# Patient Record
Sex: Female | Born: 1960 | Race: White | Hispanic: No | Marital: Married | Smoking: Never smoker
Health system: Southern US, Community
[De-identification: ages and names within clinical notes are randomized; demographics above are authoritative.]

## PROBLEM LIST (undated history)

## (undated) DIAGNOSIS — Z86711 Personal history of pulmonary embolism: Secondary | ICD-10-CM

## (undated) DIAGNOSIS — E785 Hyperlipidemia, unspecified: Secondary | ICD-10-CM

## (undated) DIAGNOSIS — Z86718 Personal history of other venous thrombosis and embolism: Secondary | ICD-10-CM

## (undated) DIAGNOSIS — N200 Calculus of kidney: Secondary | ICD-10-CM

## (undated) DIAGNOSIS — D6851 Activated protein C resistance: Secondary | ICD-10-CM

## (undated) DIAGNOSIS — F4310 Post-traumatic stress disorder, unspecified: Secondary | ICD-10-CM

## (undated) DIAGNOSIS — Z87442 Personal history of urinary calculi: Secondary | ICD-10-CM

---

## 1995-10-18 HISTORY — PX: CERVICAL FUSION: SHX112

## 1998-02-27 ENCOUNTER — Inpatient Hospital Stay (HOSPITAL_COMMUNITY): Admission: EM | Admit: 1998-02-27 | Discharge: 1998-03-01 | Payer: Self-pay | Admitting: *Deleted

## 1999-06-01 ENCOUNTER — Encounter: Payer: Self-pay | Admitting: Neurosurgery

## 1999-06-01 ENCOUNTER — Ambulatory Visit (HOSPITAL_COMMUNITY): Admission: RE | Admit: 1999-06-01 | Discharge: 1999-06-01 | Payer: Self-pay | Admitting: Neurosurgery

## 1999-11-09 ENCOUNTER — Encounter: Payer: Self-pay | Admitting: Neurosurgery

## 1999-11-09 ENCOUNTER — Ambulatory Visit (HOSPITAL_COMMUNITY): Admission: RE | Admit: 1999-11-09 | Discharge: 1999-11-09 | Payer: Self-pay | Admitting: Neurosurgery

## 2013-12-04 ENCOUNTER — Other Ambulatory Visit: Payer: Self-pay | Admitting: Internal Medicine

## 2013-12-04 DIAGNOSIS — Z1231 Encounter for screening mammogram for malignant neoplasm of breast: Secondary | ICD-10-CM

## 2013-12-16 ENCOUNTER — Ambulatory Visit
Admission: RE | Admit: 2013-12-16 | Discharge: 2013-12-16 | Disposition: A | Discharge status: Home / Self Care | Payer: Commercial Managed Care - PPO | Source: Ambulatory Visit | Attending: Internal Medicine | Admitting: Internal Medicine

## 2013-12-16 DIAGNOSIS — Z1231 Encounter for screening mammogram for malignant neoplasm of breast: Secondary | ICD-10-CM

## 2014-03-07 ENCOUNTER — Other Ambulatory Visit: Payer: Self-pay | Admitting: Orthopaedic Surgery

## 2014-03-07 DIAGNOSIS — M25561 Pain in right knee: Secondary | ICD-10-CM

## 2014-03-15 ENCOUNTER — Ambulatory Visit
Admission: RE | Admit: 2014-03-15 | Discharge: 2014-03-15 | Disposition: A | Discharge status: Home / Self Care | Payer: Commercial Managed Care - PPO | Source: Ambulatory Visit | Attending: Orthopaedic Surgery | Admitting: Orthopaedic Surgery

## 2014-03-15 DIAGNOSIS — M25561 Pain in right knee: Secondary | ICD-10-CM

## 2015-01-02 ENCOUNTER — Emergency Department (INDEPENDENT_AMBULATORY_CARE_PROVIDER_SITE_OTHER): Payer: Commercial Managed Care - PPO

## 2015-01-02 ENCOUNTER — Encounter (HOSPITAL_COMMUNITY): Payer: Self-pay | Admitting: *Deleted

## 2015-01-02 ENCOUNTER — Emergency Department (INDEPENDENT_AMBULATORY_CARE_PROVIDER_SITE_OTHER)
Admission: EM | Admit: 2015-01-02 | Discharge: 2015-01-02 | Disposition: A | Discharge status: Home / Self Care | Payer: Commercial Managed Care - PPO | Source: Home / Self Care | Attending: Emergency Medicine | Admitting: Emergency Medicine

## 2015-01-02 DIAGNOSIS — S93402A Sprain of unspecified ligament of left ankle, initial encounter: Secondary | ICD-10-CM

## 2015-01-02 HISTORY — DX: Activated protein C resistance: D68.51

## 2015-01-02 MED ORDER — HYDROCODONE-ACETAMINOPHEN 5-325 MG PO TABS: 1.0000 | ORAL_TABLET | ORAL | Status: DC | PRN

## 2015-01-02 NOTE — ED Notes (Signed)
States she was rushing down the steps with suitcases and fell 5 days ago. States nothing was going to stop her from going to [place] for vacation.  C/o pain, swelling and discoloration to L foot and ankle and L elbow.

## 2015-01-02 NOTE — ED Provider Notes (Addendum)
CSN: [id number]     Arrival date & time 01/02/15  1853 History   First MD Initiated Contact with Patient 01/02/15 2005     Chief Complaint  Patient presents with  . Fall   (Consider location/radiation/quality/duration/timing/severity/associated sxs/prior Treatment) HPI  She is a 54 year old woman here for evaluation of left foot and ankle pain. She states she fell down some stairs 5 days ago. This was on her way to the airport for a trip to [place] so she was not evaluated. She states she has had worsening pain and bruising in the left lateral ankle and foot. 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 The pain is worse with flexion and extension of her ankle. She is able to bear weight on it.  Past Medical History  Diagnosis Date  . Factor V Leiden    Past Surgical History  Procedure Laterality Date  . Spinal fusion  1997   History reviewed. No pertinent family history. History  Substance Use Topics  . Smoking status: Never Smoker   . Smokeless tobacco: Not on file  . Alcohol Use: 1.8 oz/week    3 Glasses of wine per week   OB History    No data available     Review of Systems As in history of present illness Allergies  Review of patient's allergies indicates no known allergies.  Home Medications   Prior to Admission medications   Medication Sig Start Date End Date Taking? Authorizing Provider  sertraline (ZOLOFT) 100 MG tablet Take 100 mg by mouth at bedtime.   Yes Historical Provider, MD  warfarin (COUMADIN) 5 MG tablet Take 15 mg by mouth daily.   Yes Historical Provider, MD  HYDROcodone-acetaminophen (NORCO/VICODIN) 5-325 MG per tablet Take 1-2 tablets by mouth every 4 (four) hours as needed. 01/02/15   Melony Overly, MD   BP 157/86 mmHg  Pulse 93  Temp(Src) 97.7 F (36.5 C) (Oral)  Resp 20  SpO2 100% Physical Exam  Constitutional: She is oriented to person, place, and time. She appears well-developed and well-nourished. No distress.   Cardiovascular: Normal rate.   Pulmonary/Chest: Effort normal.  Musculoskeletal:  Left ankle: Bruising at lateral malleolus. She is tender at the tip of the lateral malleolus and distally. She has 5 out of 5 strength in dorsiflexion and plantar flexion, but with pain. Left foot: Significant bruising over lateral forefoot and midfoot. She is tender at the lateral forefoot. 2+ DP pulse.  Neurological: She is alert and oriented to person, place, and time.    ED Course  Procedures (including critical care time) Labs Review Labs Reviewed - No data to display  Imaging Review No results found.   MDM   1. Left ankle sprain, initial encounter    No obvious fracture on x-rays. Final read pending. We'll place in a Banker. I will call her with the x-ray results tomorrow. Recommended ice frequently. Norco prescription for pain given that she is on Coumadin. If no improvement in 1-2 weeks, she will follow-up with her orthopedic doctor.    Melony Overly, MD 01/02/15 2054   Called patient with x-ray results.  No evidence for fracture or dislocation.  Discussed that she likely has a severe sprain or possible a tear in one of her ligaments.  If she is not improving in the next week, she will make an appointment with her orthopedic doctor.   Melony Overly, MD 01/03/15 [phone]

## 2015-01-02 NOTE — Discharge Instructions (Signed)
You likely have a bad ankle sprain. I will call you with x-ray results tomorrow. Wear the Cam Walker boot during the day. You can take it off at night. Apply ice as often as possible. Take Tylenol or Norco as needed for pain. If no improvement in 1-2 weeks, please follow-up with your orthopedic doctor.

## 2015-01-07 ENCOUNTER — Telehealth (HOSPITAL_COMMUNITY): Payer: Self-pay | Admitting: *Deleted

## 2015-01-07 NOTE — ED Notes (Signed)
Pt. called on VM for her xray results on 3/19.  I called pt. back.  She said Dr. Bridgett Larsson called her back later that day and gave her the neg. result. Margaret Phillips 01/07/2015

## 2015-06-03 ENCOUNTER — Encounter (HOSPITAL_COMMUNITY): Payer: Self-pay

## 2015-06-03 ENCOUNTER — Emergency Department (HOSPITAL_COMMUNITY): Payer: Commercial Managed Care - PPO

## 2015-06-03 ENCOUNTER — Emergency Department (HOSPITAL_COMMUNITY)
Admission: EM | Admit: 2015-06-03 | Discharge: 2015-06-03 | Disposition: A | Discharge status: Home / Self Care | Payer: Commercial Managed Care - PPO | Attending: Emergency Medicine | Admitting: Emergency Medicine

## 2015-06-03 ENCOUNTER — Telehealth: Payer: Self-pay | Admitting: *Deleted

## 2015-06-03 DIAGNOSIS — Z7901 Long term (current) use of anticoagulants: Secondary | ICD-10-CM | POA: Diagnosis not present

## 2015-06-03 DIAGNOSIS — D6851 Activated protein C resistance: Secondary | ICD-10-CM | POA: Insufficient documentation

## 2015-06-03 DIAGNOSIS — N23 Unspecified renal colic: Secondary | ICD-10-CM | POA: Insufficient documentation

## 2015-06-03 DIAGNOSIS — R109 Unspecified abdominal pain: Secondary | ICD-10-CM | POA: Diagnosis present

## 2015-06-03 LAB — URINALYSIS, ROUTINE W REFLEX MICROSCOPIC
Bilirubin Urine: NEGATIVE
GLUCOSE, UA: NEGATIVE mg/dL
Ketones, ur: NEGATIVE mg/dL
LEUKOCYTES UA: NEGATIVE
Nitrite: NEGATIVE
Protein, ur: NEGATIVE mg/dL
SPECIFIC GRAVITY, URINE: 1.007 (ref 1.005–1.030)
Urobilinogen, UA: 0.2 mg/dL (ref 0.0–1.0)
pH: 7 (ref 5.0–8.0)

## 2015-06-03 LAB — URINE MICROSCOPIC-ADD ON

## 2015-06-03 MED ORDER — ONDANSETRON 4 MG PO TBDP
4.0000 mg | ORAL_TABLET | Freq: Once | ORAL | Status: AC
  Administered 2015-06-03: 4 mg via ORAL
  Filled 2015-06-03: qty 1

## 2015-06-03 MED ORDER — ONDANSETRON 4 MG PO TBDP: 4.0000 mg | ORAL_TABLET | Freq: Three times a day (TID) | ORAL | Status: DC | PRN

## 2015-06-03 MED ORDER — OXYCODONE-ACETAMINOPHEN 5-325 MG PO TABS
1.0000 | ORAL_TABLET | Freq: Once | ORAL | Status: AC
  Administered 2015-06-03: 1 via ORAL
  Filled 2015-06-03: qty 1

## 2015-06-03 MED ORDER — OXYCODONE-ACETAMINOPHEN 5-325 MG PO TABS: 1.0000 | ORAL_TABLET | Freq: Four times a day (QID) | ORAL | Status: DC | PRN

## 2015-06-03 NOTE — ED Notes (Signed)
Contacted by [employer] requesting OK for prescription for Percocet; OK per Dr. Laneta Simmers

## 2015-06-03 NOTE — ED Provider Notes (Signed)
CSN: [id number]     Arrival date & time 06/03/15  1701 History   First MD Initiated Contact with Patient 06/03/15 1750     Chief Complaint  Patient presents with  . Flank Pain     (Consider location/radiation/quality/duration/timing/severity/associated sxs/prior Treatment) Patient is a 54 y.o. female presenting with flank pain. The history is provided by the patient.  Flank Pain This is a new problem. The current episode started 2 days ago. The problem occurs constantly. The problem has not changed since onset.Associated symptoms include abdominal pain. Nothing aggravates the symptoms. Nothing relieves the symptoms. Treatments tried: dilaudid po. The treatment provided no relief.    Past Medical History  Diagnosis Date  . Factor V Leiden   . Kidney stone    Past Surgical History  Procedure Laterality Date  . Spinal fusion  1997   History reviewed. No pertinent family history. Social History  Substance Use Topics  . Smoking status: Never Smoker   . Smokeless tobacco: Never Used  . Alcohol Use: 1.8 oz/week    3 Glasses of wine per week     Comment: weekends only   OB History    No data available     Review of Systems  Gastrointestinal: Positive for nausea and abdominal pain.  Genitourinary: Positive for flank pain.  All other systems reviewed and are negative.     Allergies  Contrast media and Betadine  Home Medications   Prior to Admission medications   Medication Sig Start Date End Date Taking? Authorizing Provider  atorvastatin (LIPITOR) 10 MG tablet Take 10 mg by mouth at bedtime. 05/18/15  Yes Historical Provider, MD  HYDROmorphone HCl (DILAUDID PO) Take 1 tablet by mouth once.   Yes Historical Provider, MD  prazosin (MINIPRESS) 2 MG capsule Take 2 mg by mouth at bedtime. 05/18/15  Yes Historical Provider, MD  promethazine (PHENERGAN) 25 MG tablet Take 25 mg by mouth every 6 (six) hours as needed for nausea or vomiting.   Yes Historical Provider, MD  sertraline  (ZOLOFT) 100 MG tablet Take 100 mg by mouth at bedtime.   Yes Historical Provider, MD  warfarin (COUMADIN) 5 MG tablet Take 10-15 mg by mouth daily. Take 15 mg MW & 10 mg on all other days   Yes Historical Provider, MD  HYDROcodone-acetaminophen (NORCO/VICODIN) 5-325 MG per tablet Take 1-2 tablets by mouth every 4 (four) hours as needed. Patient not taking: Reported on 06/03/2015 01/02/15   Melony Overly, MD   BP 158/69 mmHg  Pulse 72  Temp(Src) 97.8 F (36.6 C) (Oral)  Resp 20  Ht 5\' 6"  (1.676 m)  Wt 290 lb (131.543 kg)  BMI 46.83 kg/m2  SpO2 100% Physical Exam  Constitutional: She is oriented to person, place, and time. She appears well-developed and well-nourished. No distress.  HENT:  Head: Normocephalic.  Eyes: Conjunctivae are normal.  Neck: Neck supple. No tracheal deviation present.  Cardiovascular: Normal rate, regular rhythm and normal heart sounds.   Pulmonary/Chest: Effort normal. No respiratory distress.  Abdominal: Soft. She exhibits no distension. There is tenderness (mild left flank). There is no rebound and no guarding.  Neurological: She is alert and oriented to person, place, and time.  Skin: Skin is warm and dry.  Psychiatric: She has a normal mood and affect.    ED Course  Procedures (including critical care time) Emergency Focused Ultrasound Exam Limited Retroperitoneal Ultrasound of Kidneys  Performed and interpreted by Dr. Laneta Simmers Focused abdominal ultrasound with both kidneys imaged in  transverse and longitudinal planes in real-time. Indication: flank pain Findings: bilateral kidneys present, + shadowing on left, no anechoic areas Interpretation: no hydronephrosis visualized.  + stone on left visualized  Images archived electronically  CPT Code: [place] (NOT AT [facility])    Imaging Review Dg Abd 1 View  06/03/2015   CLINICAL DATA:  Evaluate location of reported left ureteral stone.  Initial encounter.  EXAM: ABDOMEN - 1 VIEW  COMPARISON:  None.  FINDINGS: There appears to be a large 9 mm stone overlying the left renal pelvis, and a large 1.4 cm stone overlying the interpole region of the left renal shadow.  No definite additional stones are seen. The visualized bowel gas pattern is grossly unremarkable. No free intra-abdominal air is identified, though evaluation for free air is limited on supine views.  No acute osseous abnormalities are seen.  IMPRESSION: Apparent large 9 mm stone overlying the left renal pelvis, and large 1.4 cm stone overlying the interpole region of the left renal shadow. The 9 mm stone may be causing some degree of obstruction, if the patient has associated symptoms. No definite 3 mm stone is characterized.   Electronically Signed   By: Garald Balding M.D.   On: 06/03/2015 19:14   I have personally reviewed and evaluated these images and lab results as part of my medical decision-making.   EKG Interpretation None      MDM   Final diagnoses:  Renal colic on left side    54 year old female presents with left flank pain that she has had intermittently. She was told she had a 3 mm stone by her primary doctor who ordered a plain film and told her to come to the emergency department and asked for urology to see her. On arrival she is in moderate discomfort stating that her home with by mouth Dilaudid that was prescribed for this acute pain episode did not work. She is given a single dose of Percocet by mouth which alleviated her pain.  Plain film here notes larger 9 mm stone at the renal pelvis that may be causing some renal colic but based on bedside ultrasound and the patient's symptoms I do not believe that she has ongoing obstruction. She was able to tolerate her medications without vomiting, she was provided initially very short course of Percocet at home for breakthrough with antiemetics and I recommended that she get outpatient urology follow-up to  consider procedural disposition for her symptoms if they persist.  Leo Grosser, MD 06/04/15 0006

## 2015-06-03 NOTE — ED Notes (Signed)
Pt ambulatory to bathroom

## 2015-06-03 NOTE — ED Notes (Signed)
Pt is a&ox4 and ambulatory. Questions concerns denied r/t dc.

## 2015-06-03 NOTE — ED Notes (Signed)
Patient transported to X-ray 

## 2015-06-03 NOTE — Discharge Instructions (Signed)

## 2015-06-03 NOTE — ED Notes (Signed)
Asked patient for a urine sample to which she said she cannot move and stated 'i refuse to move until I get a shot.' Pt lying supine in bed. Alert and oriented.

## 2015-06-03 NOTE — ED Notes (Signed)
Patient states she has intermittent left flank pain. Patient has a known kidney stone. Pt. Denies any blood  In the urine.

## 2015-06-04 ENCOUNTER — Encounter (HOSPITAL_COMMUNITY)
Admission: AD | Disposition: A | Discharge status: Home / Self Care | Payer: Self-pay | Source: Ambulatory Visit | Attending: Urology

## 2015-06-04 ENCOUNTER — Ambulatory Visit (HOSPITAL_COMMUNITY): Payer: Commercial Managed Care - PPO | Admitting: Anesthesiology

## 2015-06-04 ENCOUNTER — Encounter (HOSPITAL_COMMUNITY): Payer: Self-pay | Admitting: *Deleted

## 2015-06-04 ENCOUNTER — Other Ambulatory Visit: Payer: Self-pay | Admitting: Urology

## 2015-06-04 ENCOUNTER — Ambulatory Visit (HOSPITAL_COMMUNITY)
Admission: AD | Admit: 2015-06-04 | Discharge: 2015-06-04 | Disposition: A | Discharge status: Home / Self Care | Payer: Commercial Managed Care - PPO | Source: Ambulatory Visit | Attending: Urology | Admitting: Urology

## 2015-06-04 DIAGNOSIS — D6851 Activated protein C resistance: Secondary | ICD-10-CM | POA: Insufficient documentation

## 2015-06-04 DIAGNOSIS — Z79891 Long term (current) use of opiate analgesic: Secondary | ICD-10-CM | POA: Diagnosis not present

## 2015-06-04 DIAGNOSIS — N132 Hydronephrosis with renal and ureteral calculous obstruction: Secondary | ICD-10-CM | POA: Diagnosis not present

## 2015-06-04 DIAGNOSIS — Z86711 Personal history of pulmonary embolism: Secondary | ICD-10-CM | POA: Insufficient documentation

## 2015-06-04 DIAGNOSIS — Z6841 Body Mass Index (BMI) 40.0 and over, adult: Secondary | ICD-10-CM | POA: Insufficient documentation

## 2015-06-04 DIAGNOSIS — Z79899 Other long term (current) drug therapy: Secondary | ICD-10-CM | POA: Diagnosis not present

## 2015-06-04 DIAGNOSIS — Z87442 Personal history of urinary calculi: Secondary | ICD-10-CM | POA: Insufficient documentation

## 2015-06-04 DIAGNOSIS — Z7901 Long term (current) use of anticoagulants: Secondary | ICD-10-CM | POA: Diagnosis not present

## 2015-06-04 DIAGNOSIS — R109 Unspecified abdominal pain: Secondary | ICD-10-CM | POA: Diagnosis present

## 2015-06-04 HISTORY — DX: Personal history of pulmonary embolism: Z86.711

## 2015-06-04 HISTORY — PX: CYSTOSCOPY WITH RETROGRADE PYELOGRAM, URETEROSCOPY AND STENT PLACEMENT: SHX5789

## 2015-06-04 LAB — PREGNANCY, URINE: PREG TEST UR: NEGATIVE

## 2015-06-04 LAB — PROTIME-INR
INR: 2.18 — AB (ref 0.00–1.49)
Prothrombin Time: 24.1 seconds — ABNORMAL HIGH (ref 11.6–15.2)

## 2015-06-04 SURGERY — CYSTOURETEROSCOPY, WITH RETROGRADE PYELOGRAM AND STENT INSERTION
Anesthesia: General | Site: Ureter | Laterality: Left

## 2015-06-04 MED ORDER — TAMSULOSIN HCL 0.4 MG PO CAPS
0.4000 mg | ORAL_CAPSULE | Freq: Every day | ORAL | Status: DC
Start: 2015-06-04 — End: 2015-06-18

## 2015-06-04 MED ORDER — LACTATED RINGERS IV SOLN
INTRAVENOUS | Status: DC
  Administered 2015-06-04: 1000 mL via INTRAVENOUS

## 2015-06-04 MED ORDER — PROPOFOL 10 MG/ML IV BOLUS
INTRAVENOUS | Status: DC | PRN
  Administered 2015-06-04: 200 mg via INTRAVENOUS

## 2015-06-04 MED ORDER — DIPHENHYDRAMINE HCL 50 MG/ML IJ SOLN
INTRAMUSCULAR | Status: AC
  Filled 2015-06-04: qty 1

## 2015-06-04 MED ORDER — ONDANSETRON 4 MG PO TBDP: 4.0000 mg | ORAL_TABLET | Freq: Three times a day (TID) | ORAL | Status: DC | PRN

## 2015-06-04 MED ORDER — IOHEXOL 300 MG/ML  SOLN
INTRAMUSCULAR | Status: DC | PRN
  Administered 2015-06-04: 2 mL via INTRAVENOUS

## 2015-06-04 MED ORDER — METHYLPREDNISOLONE SODIUM SUCC 125 MG IJ SOLR
INTRAMUSCULAR | Status: AC
  Filled 2015-06-04: qty 2

## 2015-06-04 MED ORDER — DIPHENHYDRAMINE HCL 50 MG/ML IJ SOLN
INTRAMUSCULAR | Status: DC | PRN
  Administered 2015-06-04: 50 mg via INTRAVENOUS

## 2015-06-04 MED ORDER — LACTATED RINGERS IV SOLN
INTRAVENOUS | Status: DC | PRN
  Administered 2015-06-04: 20:00:00 via INTRAVENOUS

## 2015-06-04 MED ORDER — MIDAZOLAM HCL 2 MG/2ML IJ SOLN
INTRAMUSCULAR | Status: AC
  Filled 2015-06-04: qty 4

## 2015-06-04 MED ORDER — FENTANYL CITRATE (PF) 100 MCG/2ML IJ SOLN
INTRAMUSCULAR | Status: DC | PRN
  Administered 2015-06-04 (×2): 50 ug via INTRAVENOUS

## 2015-06-04 MED ORDER — DEXTROSE 5 % IV SOLN
3.0000 g | INTRAVENOUS | Status: AC
  Administered 2015-06-04: 3 g via INTRAVENOUS
  Filled 2015-06-04: qty 3000

## 2015-06-04 MED ORDER — FENTANYL CITRATE (PF) 100 MCG/2ML IJ SOLN
INTRAMUSCULAR | Status: AC
  Filled 2015-06-04: qty 4

## 2015-06-04 MED ORDER — FENTANYL CITRATE (PF) 100 MCG/2ML IJ SOLN: 25.0000 ug | INTRAMUSCULAR | Status: DC | PRN

## 2015-06-04 MED ORDER — MEPERIDINE HCL 50 MG/ML IJ SOLN: 6.2500 mg | INTRAMUSCULAR | Status: DC | PRN

## 2015-06-04 MED ORDER — LIDOCAINE HCL (CARDIAC) 10 MG/ML IV SOLN
INTRAVENOUS | Status: DC | PRN
  Administered 2015-06-04: 100 mg via INTRAVENOUS

## 2015-06-04 MED ORDER — METHYLPREDNISOLONE SODIUM SUCC 125 MG IJ SOLR
INTRAMUSCULAR | Status: DC | PRN
  Administered 2015-06-04: 125 mg via INTRAVENOUS

## 2015-06-04 MED ORDER — ONDANSETRON HCL 4 MG/2ML IJ SOLN
INTRAMUSCULAR | Status: DC | PRN
  Administered 2015-06-04: 4 mg via INTRAVENOUS

## 2015-06-04 MED ORDER — OXYCODONE-ACETAMINOPHEN 5-325 MG PO TABS: 1.0000 | ORAL_TABLET | Freq: Four times a day (QID) | ORAL | Status: DC | PRN

## 2015-06-04 MED ORDER — MIDAZOLAM HCL 5 MG/5ML IJ SOLN
INTRAMUSCULAR | Status: DC | PRN
  Administered 2015-06-04: 2 mg via INTRAVENOUS

## 2015-06-04 MED ORDER — PROMETHAZINE HCL 25 MG/ML IJ SOLN: 6.2500 mg | INTRAMUSCULAR | Status: DC | PRN

## 2015-06-04 MED ORDER — PROPOFOL 10 MG/ML IV BOLUS
INTRAVENOUS | Status: AC
  Filled 2015-06-04: qty 20

## 2015-06-04 MED ORDER — STERILE WATER FOR IRRIGATION IR SOLN
Status: DC | PRN
  Administered 2015-06-04: 3000 mL

## 2015-06-04 SURGICAL SUPPLY — 19 items
BAG URO CATCHER STRL LF (DRAPE) ×2 IMPLANT
BASKET LASER NITINOL 1.9FR (BASKET) IMPLANT
BSKT STON RTRVL 120 1.9FR (BASKET)
CATH INTERMIT  6FR 70CM (CATHETERS) ×2 IMPLANT
CLOTH BEACON ORANGE TIMEOUT ST (SAFETY) ×2 IMPLANT
EXTRACTOR STONE NITINOL NGAGE (UROLOGICAL SUPPLIES) IMPLANT
FIBER LASER FLEXIVA 200 (UROLOGICAL SUPPLIES) IMPLANT
FIBER LASER TRAC TIP (UROLOGICAL SUPPLIES) IMPLANT
GLOVE BIO SURGEON STRL SZ8 (GLOVE) ×2 IMPLANT
GOWN STRL REUS W/TWL XL LVL3 (GOWN DISPOSABLE) ×2 IMPLANT
GUIDEWIRE STR DUAL SENSOR (WIRE) ×2 IMPLANT
IV NS 1000ML (IV SOLUTION) ×2
IV NS 1000ML BAXH (IV SOLUTION) ×1 IMPLANT
MANIFOLD NEPTUNE II (INSTRUMENTS) ×2 IMPLANT
PACK CYSTO (CUSTOM PROCEDURE TRAY) ×2 IMPLANT
STENT CONTOUR 6FRX26X.038 (STENTS) ×2 IMPLANT
SYR CONTROL 10ML LL (SYRINGE) ×2 IMPLANT
TUBE FEEDING 8FR 16IN STR KANG (MISCELLANEOUS) ×2 IMPLANT
TUBING CONNECTING 10 (TUBING) ×2 IMPLANT

## 2015-06-04 NOTE — Anesthesia Procedure Notes (Signed)
Procedure Name: Intubation Date/Time: 06/04/2015 7:56 PM Performed by: Enrigue Catena E Pre-anesthesia Checklist: Patient identified, Emergency Drugs available, Suction available and Patient being monitored Patient Re-evaluated:Patient Re-evaluated prior to inductionOxygen Delivery Method: Circle System Utilized Preoxygenation: Pre-oxygenation with 100% oxygen Intubation Type: IV induction LMA: LMA inserted LMA Size: 4.0 Tube type: Oral Number of attempts: 1 Placement Confirmation: positive ETCO2 Tube secured with: Tape Dental Injury: Teeth and Oropharynx as per pre-operative assessment

## 2015-06-04 NOTE — Anesthesia Preprocedure Evaluation (Addendum)
Anesthesia Evaluation  Patient identified by MRN, date of birth, ID band Patient awake    Reviewed: Allergy & Precautions, NPO status , Patient's Chart, lab work & pertinent test results  Airway Mallampati: II  TM Distance: >3 FB Neck ROM: Full    Dental no notable dental hx.    Pulmonary PE breath sounds clear to auscultation  Pulmonary exam normal       Cardiovascular negative cardio ROS Normal cardiovascular examRhythm:Regular Rate:Normal     Neuro/Psych negative neurological ROS  negative psych ROS   GI/Hepatic negative GI ROS, Neg liver ROS,   Endo/Other  negative endocrine ROSMorbid obesity  Renal/GU negative Renal ROS  negative genitourinary   Musculoskeletal negative musculoskeletal ROS (+)   Abdominal   Peds negative pediatric ROS (+)  Hematology Factor V Leiden   Anesthesia Other Findings   Reproductive/Obstetrics negative OB ROS                             Anesthesia Physical Anesthesia Plan  ASA: III  Anesthesia Plan: General   Post-op Pain Management:    Induction: Intravenous  Airway Management Planned: LMA  Additional Equipment:   Intra-op Plan:   Post-operative Plan: Extubation in OR  Informed Consent: I have reviewed the patients History and Physical, chart, labs and discussed the procedure including the risks, benefits and alternatives for the proposed anesthesia with the patient or authorized representative who has indicated his/her understanding and acceptance.   Dental advisory given  Plan Discussed with: CRNA  Anesthesia Plan Comments:        Anesthesia Quick Evaluation

## 2015-06-04 NOTE — Anesthesia Postprocedure Evaluation (Signed)
  Anesthesia Post-op Note  Patient: Margaret Phillips  Procedure(s) Performed: Procedure(s) (LRB): CYSTOSCOPY WITH RETROGRADE PYELOGRAM,  AND STENT PLACEMENT (Left)  Patient Location: PACU  Anesthesia Type: General  Level of Consciousness: awake and alert   Airway and Oxygen Therapy: Patient Spontanous Breathing  Post-op Pain: mild  Post-op Assessment: Post-op Vital signs reviewed, Patient's Cardiovascular Status Stable, Respiratory Function Stable, Patent Airway and No signs of Nausea or vomiting  Last Vitals:  Filed Vitals:   06/04/15 2050  BP:   Pulse: 65  Temp: 37.3 C  Resp: 14    Post-op Vital Signs: stable   Complications: No apparent anesthesia complications

## 2015-06-04 NOTE — Op Note (Signed)
.  Preoperative diagnosis: Left ureteral stone  Postoperative diagnosis: Same  Procedure: 1 cystoscopy 2. Left retrograde pyelography 3.  Intraoperative fluoroscopy, under one hour, with interpretation 4. Left 6 x 26 JJ stent placement  Attending: Nicolette Bang  Anesthesia: General  Estimated blood loss: None  Drains: Left 6 x 26 JJ ureteral stent without tether  Specimens: none  Antibiotics: Ancef  Findings: left UPJ stone. Moderate hydronephrosis. No masses/lesions in the bladder. Ureteral orifices in normal anatomic location.  Indications: Patient is a 54 year old female with a history of left UPJ stone and intractable pain.  After discussing treatment options, they decided proceed with left stent placement.  Procedure her in detail: The patient was brought to the operating room and a brief timeout was done to ensure correct patient, correct procedure, correct site.  General anesthesia was administered patient was placed in dorsal lithotomy position.  Their genitalia was then prepped and draped in usual sterile fashion.  A rigid 50 French cystoscope was passed in the urethra and the bladder.  Bladder was inspected free masses or lesions.  the ureteral orifices were in the normal orthotopic locations.  a 6 french ureteral catheter was then instilled into the left ureteral orifice.  a gentle retrograde was obtained and findings noted above.  we then placed a sensor wire through the ureteral catheter and advanced up to the renal pelvis.    We then placed a 6 x 26 double-j ureteral stent over the wire.  We then removed the wire and good coil was noted in the the renal pelvis under fluoroscopy and the bladder under direct vision.   the bladder was then drained and this concluded the procedure which was well tolerated by patient.  Complications: None  Condition: Stable, extubated, transferred to PACU  Plan: Patient is to be discharged home. she will followup in 2 weeks for stone  extraction.

## 2015-06-04 NOTE — Transfer of Care (Signed)
Immediate Anesthesia Transfer of Care Note  Patient: Margaret Phillips  Procedure(s) Performed: Procedure(s): CYSTOSCOPY WITH RETROGRADE PYELOGRAM,  AND STENT PLACEMENT (Left)  Patient Location: PACU  Anesthesia Type:General  Level of Consciousness: awake, alert , oriented and patient cooperative  Airway & Oxygen Therapy: Patient Spontanous Breathing and Patient connected to face mask oxygen  Post-op Assessment: Report given to RN, Post -op Vital signs reviewed and stable and Patient moving all extremities X 4  Post vital signs: stable  Last Vitals:  Filed Vitals:   06/04/15 2030  BP: 130/80  Pulse: 73  Temp:   Resp: 14    Complications: No apparent anesthesia complications

## 2015-06-04 NOTE — H&P (Signed)
Urology Admission H&P  Chief Complaint: left flank pain  History of Present Illness: Margaret Phillips is a 54yo with a hx of nephrolithiasis who has failed medical expulsive therapy. She has persistent left flank paina dn associated nausea/vomiting. She denies fevers/chills/sweats.  Past Medical History  Diagnosis Date  . Factor V Leiden   . Kidney stone   . History of pulmonary embolism 1987 and 1998   Past Surgical History  Procedure Laterality Date  . Spinal fusion  1997    Home Medications:  Prescriptions prior to admission  Medication Sig Dispense Refill Last Dose  . atorvastatin (LIPITOR) 10 MG tablet Take 10 mg by mouth at bedtime.  4 06/03/2015 at Unknown time  . ondansetron (ZOFRAN ODT) 4 MG disintegrating tablet Take 1 tablet (4 mg total) by mouth every 8 (eight) hours as needed for nausea or vomiting. 10 tablet 0 hasn't needed  . oxyCODONE-acetaminophen (ROXICET) 5-325 MG per tablet Take 1 tablet by mouth every 6 (six) hours as needed for severe pain. 6 tablet 0 06/04/2015 at 0400  . prazosin (MINIPRESS) 2 MG capsule Take 2 mg by mouth at bedtime.  7 06/04/2015 at 1400  . promethazine (PHENERGAN) 25 MG tablet Take 25 mg by mouth every 6 (six) hours as needed for nausea or vomiting.   06/03/2015 at Unknown time  . sertraline (ZOLOFT) 100 MG tablet Take 100 mg by mouth at bedtime.   06/03/2015 at Unknown time  . tamsulosin (FLOMAX) 0.4 MG CAPS capsule Take 0.4 mg by mouth daily.   06/04/2015 at 1400  . warfarin (COUMADIN) 5 MG tablet Take 10-15 mg by mouth daily. Take 15 mg MWF & 10 mg on all other days   06/03/2015 at 2115   Allergies:  Allergies  Allergen Reactions  . Betadine [Povidone Iodine] Hives  . Contrast Media [Iodinated Diagnostic Agents] Anaphylaxis  . Iodine Anaphylaxis and Hives    History reviewed. No pertinent family history. Social History:  reports that she has never smoked. She has never used smokeless tobacco. She reports that she drinks about 1.8 oz of alcohol per  week. She reports that she does not use illicit drugs.  Review of Systems  Genitourinary: Positive for flank pain.  All other systems reviewed and are negative.   Physical Exam:  Vital signs in last 24 hours: Temp:  [98.4 F (36.9 C)] 98.4 F (36.9 C) (08/18 1612) Pulse Rate:  [72-74] 74 (08/18 1612) Resp:  [18] 18 (08/18 1612) BP: (143-146)/(66-92) 146/92 mmHg (08/18 1612) SpO2:  [93 %-97 %] 93 % (08/18 1612) Weight:  [131.362 kg (289 lb 9.6 oz)] 131.362 kg (289 lb 9.6 oz) (08/18 1654) Physical Exam  Constitutional: She is oriented to person, place, and time. She appears well-developed and well-nourished.  HENT:  Head: Normocephalic and atraumatic.  Eyes: EOM are normal. Pupils are equal, round, and reactive to light.  Neck: Normal range of motion. Neck supple.  Cardiovascular: Normal rate and regular rhythm.   Respiratory: Effort normal and breath sounds normal.  GI: Soft. She exhibits no distension.  Musculoskeletal: Normal range of motion.  Neurological: She is alert and oriented to person, place, and time.  Skin: Skin is warm and dry.  Psychiatric: She has a normal mood and affect. Her behavior is normal. Judgment and thought content normal.    Laboratory Data:  Results for orders placed or performed during the hospital encounter of 06/04/15 (from the past 24 hour(s))  PT- INR Day of Surgery     Status:  Abnormal   Collection Time: 06/04/15  3:40 PM  Result Value Ref Range   Prothrombin Time 24.1 (H) 11.6 - 15.2 seconds   INR 2.18 (H) 0.00 - 1.49  Pregnancy, urine     Status: None   Collection Time: 06/04/15  5:05 PM  Result Value Ref Range   Preg Test, Ur NEGATIVE NEGATIVE   No results found for this or any previous visit (from the past 240 hour(s)). Creatinine: No results for input(s): CREATININE in the last 168 hours.   Impression/Assessment:  Left UPJ stone  Plan:  Risks/benefits/alternatives to left ureteral stent placement was explained to the patient  and she understands and wishes to proceed with surgery  Lesleyann Fichter L 06/04/2015, 6:47 PM

## 2015-06-04 NOTE — Discharge Instructions (Signed)
Ureteral Stent Implantation, Care After °Refer to this sheet in the next few weeks. These instructions provide you with information on caring for yourself after your procedure. Your health care provider may also give you more specific instructions. Your treatment has been planned according to current medical practices, but problems sometimes occur. Call your health care provider if you have any problems or questions after your procedure. °WHAT TO EXPECT AFTER THE PROCEDURE °You should be back to normal activity within 48 hours after the procedure. Nausea and vomiting may occur and are commonly the result of anesthesia. °It is common to experience sharp pain in the back or lower abdomen and penis with voiding. This is caused by movement of the ends of the stent with the act of urinating. It usually goes away within minutes after you have stopped urinating. °HOME CARE INSTRUCTIONS °Make sure to drink plenty of fluids. You may have small amounts of bleeding, causing your urine to be red. This is normal. Certain movements may trigger pain or a feeling that you need to urinate. You may be given medicines to prevent infection or bladder spasms. Be sure to take all medicines as directed. Only take over-the-counter or prescription medicines for pain, discomfort, or fever as directed by your health care provider. Do not take aspirin, as this can make bleeding worse. °Your stent will be left in until the blockage is resolved. This may take 2 weeks or longer, depending on the reason for stent implantation. You may have an X-ray exam to make sure your ureter is open and that the stent has not moved out of position (migrated). The stent can be removed by your health care provider in the office. Medicines may be given for comfort while the stent is being removed. Be sure to keep all follow-up appointments so your health care provider can check that you are healing properly. °SEEK MEDICAL CARE IF: °· You experience increasing  pain. °· Your pain medicine is not working. °SEEK IMMEDIATE MEDICAL CARE IF: °· Your urine is dark red or has blood clots. °· You are leaking urine (incontinent). °· You have a fever, chills, feeling sick to your stomach (nausea), or vomiting. °· Your pain is not relieved by pain medicine. °· The end of the stent comes out of the urethra. °· You are unable to urinate. °Document Released: 06/05/2013 Document Revised: 10/08/2013 Document Reviewed: 06/05/2013 °[employer]® Patient Information ©2015 [employer]. This information is not intended to replace advice given to you by your health care provider. Make sure you discuss any questions you have with your health care provider. ° °

## 2015-06-04 NOTE — Brief Op Note (Signed)
06/04/2015  8:13 PM  PATIENT:  Margaret Phillips  54 y.o. female  PRE-OPERATIVE DIAGNOSIS:  left ureteral pelvic junction stone  POST-OPERATIVE DIAGNOSIS:  left ureteral pelvic junction stone  PROCEDURE:  Procedure(s): CYSTOSCOPY WITH RETROGRADE PYELOGRAM,  AND STENT PLACEMENT (Left)  SURGEON:  Surgeon(s) and Role:    * Cleon Gustin, MD - Primary  PHYSICIAN ASSISTANT:   ASSISTANTS: none   ANESTHESIA:   general  EBL:     BLOOD ADMINISTERED:none  DRAINS: 6x26 JJ ureteral stent without tether  LOCAL MEDICATIONS USED:  NONE  SPECIMEN:  No Specimen  DISPOSITION OF SPECIMEN:  N/A  COUNTS:  YES  TOURNIQUET:  * No tourniquets in log *  DICTATION: .Note written in EPIC  PLAN OF CARE: Discharge to home after PACU  PATIENT DISPOSITION:  PACU - hemodynamically stable.   Delay start of Pharmacological VTE agent (>24hrs) due to surgical blood loss or risk of bleeding: not applicable

## 2015-06-05 ENCOUNTER — Other Ambulatory Visit: Payer: Self-pay | Admitting: Urology

## 2015-06-05 ENCOUNTER — Encounter (HOSPITAL_COMMUNITY): Payer: Self-pay | Admitting: Urology

## 2015-06-11 ENCOUNTER — Encounter (HOSPITAL_BASED_OUTPATIENT_CLINIC_OR_DEPARTMENT_OTHER): Payer: Self-pay | Admitting: *Deleted

## 2015-06-12 ENCOUNTER — Encounter (HOSPITAL_BASED_OUTPATIENT_CLINIC_OR_DEPARTMENT_OTHER): Payer: Self-pay | Admitting: *Deleted

## 2015-06-15 ENCOUNTER — Encounter (HOSPITAL_BASED_OUTPATIENT_CLINIC_OR_DEPARTMENT_OTHER): Payer: Self-pay | Admitting: *Deleted

## 2015-06-15 NOTE — Progress Notes (Signed)
NPO AFTER MN.  ARRIVE AT 0600.  CURRENT LAB RESULTS IN CHART AND EKG.  WILL TAKE FLOMAX AND DITROPAN AM DOS W/ SIPS OF WATER, IF NEEDED TAKE OXYCODONE/ ZOFRAN .

## 2015-06-18 ENCOUNTER — Encounter (HOSPITAL_BASED_OUTPATIENT_CLINIC_OR_DEPARTMENT_OTHER)
Admission: RE | Disposition: A | Discharge status: Home / Self Care | Payer: Self-pay | Source: Ambulatory Visit | Attending: Urology

## 2015-06-18 ENCOUNTER — Encounter (HOSPITAL_BASED_OUTPATIENT_CLINIC_OR_DEPARTMENT_OTHER): Payer: Self-pay | Admitting: *Deleted

## 2015-06-18 ENCOUNTER — Ambulatory Visit (HOSPITAL_BASED_OUTPATIENT_CLINIC_OR_DEPARTMENT_OTHER): Payer: Commercial Managed Care - PPO | Admitting: Anesthesiology

## 2015-06-18 ENCOUNTER — Ambulatory Visit (HOSPITAL_BASED_OUTPATIENT_CLINIC_OR_DEPARTMENT_OTHER)
Admission: RE | Admit: 2015-06-18 | Discharge: 2015-06-18 | Disposition: A | Discharge status: Home / Self Care | Payer: Commercial Managed Care - PPO | Source: Ambulatory Visit | Attending: Urology | Admitting: Urology

## 2015-06-18 DIAGNOSIS — Z6841 Body Mass Index (BMI) 40.0 and over, adult: Secondary | ICD-10-CM | POA: Diagnosis not present

## 2015-06-18 DIAGNOSIS — Z86718 Personal history of other venous thrombosis and embolism: Secondary | ICD-10-CM | POA: Insufficient documentation

## 2015-06-18 DIAGNOSIS — G4733 Obstructive sleep apnea (adult) (pediatric): Secondary | ICD-10-CM | POA: Insufficient documentation

## 2015-06-18 DIAGNOSIS — N200 Calculus of kidney: Secondary | ICD-10-CM | POA: Diagnosis not present

## 2015-06-18 DIAGNOSIS — E785 Hyperlipidemia, unspecified: Secondary | ICD-10-CM | POA: Insufficient documentation

## 2015-06-18 DIAGNOSIS — D6851 Activated protein C resistance: Secondary | ICD-10-CM | POA: Diagnosis not present

## 2015-06-18 DIAGNOSIS — Z86711 Personal history of pulmonary embolism: Secondary | ICD-10-CM | POA: Insufficient documentation

## 2015-06-18 DIAGNOSIS — Z87442 Personal history of urinary calculi: Secondary | ICD-10-CM | POA: Insufficient documentation

## 2015-06-18 DIAGNOSIS — R109 Unspecified abdominal pain: Secondary | ICD-10-CM | POA: Diagnosis present

## 2015-06-18 HISTORY — DX: Post-traumatic stress disorder, unspecified: F43.10

## 2015-06-18 HISTORY — DX: Hyperlipidemia, unspecified: E78.5

## 2015-06-18 HISTORY — PX: CYSTOSCOPY/RETROGRADE/URETEROSCOPY/STONE EXTRACTION WITH BASKET: SHX5317

## 2015-06-18 HISTORY — DX: Personal history of other venous thrombosis and embolism: Z86.718

## 2015-06-18 HISTORY — PX: HOLMIUM LASER APPLICATION: SHX5852

## 2015-06-18 HISTORY — DX: Calculus of kidney: N20.0

## 2015-06-18 HISTORY — PX: CYSTOSCOPY WITH URETEROSCOPY: SHX5123

## 2015-06-18 HISTORY — DX: Personal history of urinary calculi: Z87.442

## 2015-06-18 HISTORY — PX: CYSTOSCOPY W/ URETERAL STENT PLACEMENT: SHX1429

## 2015-06-18 SURGERY — CYSTOSCOPY, WITH CALCULUS REMOVAL USING BASKET
Anesthesia: General | Site: Ureter | Laterality: Left

## 2015-06-18 MED ORDER — OXYBUTYNIN CHLORIDE ER 10 MG PO TB24: 10.0000 mg | ORAL_TABLET | Freq: Every morning | ORAL | Status: DC

## 2015-06-18 MED ORDER — FENTANYL CITRATE (PF) 100 MCG/2ML IJ SOLN
INTRAMUSCULAR | Status: DC | PRN
  Administered 2015-06-18: 50 ug via INTRAVENOUS
  Administered 2015-06-18 (×2): 25 ug via INTRAVENOUS
  Administered 2015-06-18 (×2): 50 ug via INTRAVENOUS

## 2015-06-18 MED ORDER — FENTANYL CITRATE (PF) 100 MCG/2ML IJ SOLN
INTRAMUSCULAR | Status: AC
  Filled 2015-06-18: qty 2

## 2015-06-18 MED ORDER — LACTATED RINGERS IV SOLN
INTRAVENOUS | Status: DC
  Administered 2015-06-18 (×2): via INTRAVENOUS
  Filled 2015-06-18: qty 1000

## 2015-06-18 MED ORDER — ACETAMINOPHEN 10 MG/ML IV SOLN
INTRAVENOUS | Status: DC | PRN
  Administered 2015-06-18: 1000 mg via INTRAVENOUS

## 2015-06-18 MED ORDER — DEXTROSE 5 % IV SOLN
2.0000 g | INTRAVENOUS | Status: AC
  Administered 2015-06-18: 2 g via INTRAVENOUS
  Filled 2015-06-18: qty 2

## 2015-06-18 MED ORDER — MIDAZOLAM HCL 5 MG/5ML IJ SOLN
INTRAMUSCULAR | Status: DC | PRN
  Administered 2015-06-18: 2 mg via INTRAVENOUS

## 2015-06-18 MED ORDER — TAMSULOSIN HCL 0.4 MG PO CAPS: 0.4000 mg | ORAL_CAPSULE | Freq: Every day | ORAL | Status: DC

## 2015-06-18 MED ORDER — ONDANSETRON 4 MG PO TBDP: 4.0000 mg | ORAL_TABLET | Freq: Three times a day (TID) | ORAL | Status: DC | PRN

## 2015-06-18 MED ORDER — FENTANYL CITRATE (PF) 100 MCG/2ML IJ SOLN
25.0000 ug | INTRAMUSCULAR | Status: DC | PRN
  Administered 2015-06-18 (×3): 50 ug via INTRAVENOUS
  Filled 2015-06-18: qty 1

## 2015-06-18 MED ORDER — URELLE 81 MG PO TABS
ORAL_TABLET | ORAL | Status: AC
  Filled 2015-06-18: qty 1

## 2015-06-18 MED ORDER — KETOROLAC TROMETHAMINE 30 MG/ML IJ SOLN
30.0000 mg | Freq: Once | INTRAMUSCULAR | Status: DC
  Filled 2015-06-18: qty 1

## 2015-06-18 MED ORDER — SODIUM CHLORIDE 0.9 % IR SOLN
Status: DC | PRN
  Administered 2015-06-18: 4000 mL

## 2015-06-18 MED ORDER — CEFTRIAXONE SODIUM 2 G IJ SOLR
INTRAMUSCULAR | Status: AC
  Filled 2015-06-18: qty 4

## 2015-06-18 MED ORDER — PROMETHAZINE HCL 25 MG/ML IJ SOLN
6.2500 mg | INTRAMUSCULAR | Status: DC | PRN
  Administered 2015-06-18: 6.25 mg via INTRAVENOUS
  Filled 2015-06-18: qty 1

## 2015-06-18 MED ORDER — EPHEDRINE SULFATE 50 MG/ML IJ SOLN
INTRAMUSCULAR | Status: DC | PRN
Start: 2015-06-18 — End: 2015-06-18
  Administered 2015-06-18 (×2): 10 mg via INTRAVENOUS

## 2015-06-18 MED ORDER — OXYCODONE-ACETAMINOPHEN 5-325 MG PO TABS: 1.0000 | ORAL_TABLET | Freq: Four times a day (QID) | ORAL | Status: DC | PRN

## 2015-06-18 MED ORDER — OXYCODONE HCL 5 MG PO TABS
5.0000 mg | ORAL_TABLET | Freq: Once | ORAL | Status: AC
  Administered 2015-06-18: 5 mg via ORAL
  Filled 2015-06-18: qty 1

## 2015-06-18 MED ORDER — PROPOFOL 10 MG/ML IV BOLUS
INTRAVENOUS | Status: DC | PRN
  Administered 2015-06-18: 200 mg via INTRAVENOUS

## 2015-06-18 MED ORDER — ONDANSETRON HCL 4 MG/2ML IJ SOLN
INTRAMUSCULAR | Status: DC | PRN
  Administered 2015-06-18: 4 mg via INTRAVENOUS

## 2015-06-18 MED ORDER — MIDAZOLAM HCL 2 MG/2ML IJ SOLN
INTRAMUSCULAR | Status: AC
  Filled 2015-06-18: qty 2

## 2015-06-18 MED ORDER — LIDOCAINE HCL (CARDIAC) 20 MG/ML IV SOLN
INTRAVENOUS | Status: DC | PRN
  Administered 2015-06-18: 80 mg via INTRAVENOUS

## 2015-06-18 MED ORDER — URELLE 81 MG PO TABS
1.0000 | ORAL_TABLET | Freq: Four times a day (QID) | ORAL | Status: DC
  Administered 2015-06-18: 81 mg via ORAL
  Filled 2015-06-18: qty 1

## 2015-06-18 MED ORDER — PROMETHAZINE HCL 25 MG/ML IJ SOLN
INTRAMUSCULAR | Status: AC
  Filled 2015-06-18: qty 1

## 2015-06-18 MED ORDER — FENTANYL CITRATE (PF) 100 MCG/2ML IJ SOLN
INTRAMUSCULAR | Status: AC
  Filled 2015-06-18: qty 4

## 2015-06-18 MED ORDER — KETOROLAC TROMETHAMINE 30 MG/ML IJ SOLN
INTRAMUSCULAR | Status: DC | PRN
  Administered 2015-06-18: 30 mg via INTRAVENOUS

## 2015-06-18 MED ORDER — DEXTROSE 5 % IV SOLN
1.0000 g | INTRAVENOUS | Status: DC
  Filled 2015-06-18: qty 10

## 2015-06-18 MED ORDER — DEXAMETHASONE SODIUM PHOSPHATE 4 MG/ML IJ SOLN
INTRAMUSCULAR | Status: DC | PRN
  Administered 2015-06-18: 10 mg via INTRAVENOUS

## 2015-06-18 MED ORDER — OXYCODONE HCL 5 MG PO TABS
ORAL_TABLET | ORAL | Status: AC
  Filled 2015-06-18: qty 1

## 2015-06-18 SURGICAL SUPPLY — 30 items
BAG URO CATCHER STRL LF (DRAPE) ×2 IMPLANT
BASKET LASER NITINOL 1.9FR (BASKET) IMPLANT
BASKET STONE 1.7 NGAGE (UROLOGICAL SUPPLIES) ×2 IMPLANT
BASKET ZERO TIP NITINOL 2.4FR (BASKET) IMPLANT
BSKT STON RTRVL 120 1.9FR (BASKET)
BSKT STON RTRVL ZERO TP 2.4FR (BASKET)
CANISTER SUCT LVC 12 LTR MEDI- (MISCELLANEOUS) IMPLANT
CATH INTERMIT  6FR 70CM (CATHETERS) IMPLANT
CLOTH BEACON ORANGE TIMEOUT ST (SAFETY) ×2 IMPLANT
FIBER LASER FLEXIVA 365 (UROLOGICAL SUPPLIES) IMPLANT
FIBER LASER TRAC TIP (UROLOGICAL SUPPLIES) ×4 IMPLANT
GLOVE BIO SURGEON STRL SZ 6.5 (GLOVE) ×2 IMPLANT
GLOVE BIO SURGEON STRL SZ8 (GLOVE) ×2 IMPLANT
GLOVE BIOGEL PI IND STRL 6.5 (GLOVE) ×2 IMPLANT
GLOVE BIOGEL PI INDICATOR 6.5 (GLOVE) ×2
GOWN STRL REUS W/ TWL LRG LVL3 (GOWN DISPOSABLE) ×1 IMPLANT
GOWN STRL REUS W/ TWL XL LVL3 (GOWN DISPOSABLE) ×1 IMPLANT
GOWN STRL REUS W/TWL LRG LVL3 (GOWN DISPOSABLE) ×2
GOWN STRL REUS W/TWL XL LVL3 (GOWN DISPOSABLE) ×2
GUIDEWIRE ANG ZIPWIRE 038X150 (WIRE) ×2 IMPLANT
GUIDEWIRE STR DUAL SENSOR (WIRE) ×2 IMPLANT
IV NS 1000ML (IV SOLUTION) ×2
IV NS 1000ML BAXH (IV SOLUTION) ×1 IMPLANT
IV NS IRRIG 3000ML ARTHROMATIC (IV SOLUTION) ×2 IMPLANT
MANIFOLD NEPTUNE II (INSTRUMENTS) ×2 IMPLANT
PACK CYSTO (CUSTOM PROCEDURE TRAY) ×2 IMPLANT
STENT URET 6FRX26 CONTOUR (STENTS) ×2 IMPLANT
SYRINGE 10CC LL (SYRINGE) ×2 IMPLANT
TUBE FEEDING 8FR 16IN STR KANG (MISCELLANEOUS) IMPLANT
WATER STERILE IRR 500ML POUR (IV SOLUTION) ×2 IMPLANT

## 2015-06-18 NOTE — Discharge Instructions (Signed)
Kidney Stones °Kidney stones (urolithiasis) are deposits that form inside your kidneys. The intense pain is caused by the stone moving through the urinary tract. When the stone moves, the ureter goes into spasm around the stone. The stone is usually passed in the urine.  °CAUSES  °1. A disorder that makes certain neck glands produce too much parathyroid hormone (primary hyperparathyroidism). °2. A buildup of uric acid crystals, similar to gout in your joints. °3. Narrowing (stricture) of the ureter. °4. A kidney obstruction present at birth (congenital obstruction). °5. Previous surgery on the kidney or ureters. °6. Numerous kidney infections. °SYMPTOMS  °· Feeling sick to your stomach (nauseous). °· Throwing up (vomiting). °· Blood in the urine (hematuria). °· Pain that usually spreads (radiates) to the groin. °· Frequency or urgency of urination. °DIAGNOSIS  °· Taking a history and physical exam. °· Blood or urine tests. °· CT scan. °· Occasionally, an examination of the inside of the urinary bladder (cystoscopy) is performed. °TREATMENT  °· Observation. °· Increasing your fluid intake. °· Extracorporeal shock wave lithotripsy--This is a noninvasive procedure that uses shock waves to break up kidney stones. °· Surgery may be needed if you have severe pain or persistent obstruction. There are various surgical procedures. Most of the procedures are performed with the use of small instruments. Only small incisions are needed to accommodate these instruments, so recovery time is minimized. °The size, location, and chemical composition are all important variables that will determine the proper choice of action for you. Talk to your health care provider to better understand your situation so that you will minimize the risk of injury to yourself and your kidney.  °HOME CARE INSTRUCTIONS  °· Drink enough water and fluids to keep your urine clear or pale yellow. This will help you to pass the stone or stone  fragments. °· Strain all urine through the provided strainer. Keep all particulate matter and stones for your health care provider to see. The stone causing the pain may be as small as a grain of salt. It is very important to use the strainer each and every time you pass your urine. The collection of your stone will allow your health care provider to analyze it and verify that a stone has actually passed. The stone analysis will often identify what you can do to reduce the incidence of recurrences. °· Only take over-the-counter or prescription medicines for pain, discomfort, or fever as directed by your health care provider. °· Make a follow-up appointment with your health care provider as directed. °· Get follow-up X-rays if required. The absence of pain does not always mean that the stone has passed. It may have only stopped moving. If the urine remains completely obstructed, it can cause loss of kidney function or even complete destruction of the kidney. It is your responsibility to make sure X-rays and follow-ups are completed. Ultrasounds of the kidney can show blockages and the status of the kidney. Ultrasounds are not associated with any radiation and can be performed easily in a matter of minutes. °SEEK MEDICAL CARE IF: °· You experience pain that is progressive and unresponsive to any pain medicine you have been prescribed. °SEEK IMMEDIATE MEDICAL CARE IF:  °· Pain cannot be controlled with the prescribed medicine. °· You have a fever or shaking chills. °· The severity or intensity of pain increases over 18 hours and is not relieved by pain medicine. °· You develop a new onset of abdominal pain. °· You feel faint or pass out. °·   You are unable to urinate. °MAKE SURE YOU:  °· Understand these instructions. °· Will watch your condition. °· Will get help right away if you are not doing well or get worse. °Document Released: 10/03/2005 Document Revised: 06/05/2013 Document Reviewed: 03/06/2013 °[employer]®  Patient Information ©2015 [employer]. This information is not intended to replace advice given to you by your health care provider. Make sure you discuss any questions you have with your health care provider. ° ° °[facility] °[phone] °Post Ureteroscopy With or Without Stent Instructions ° °Definitions: ° °Ureter: The duct that transports urine from the kidney to the bladder. °Stent:   A plastic hollow tube that is placed into the ureter, from the kidney to the bladder to prevent the ureter from swelling shut. ° °GENERAL INSTRUCTIONS: ° °Despite the fact that no skin incisions were used, the area around the ureter and bladder is raw and irritated. The stent is a foreign body which will further irritate the bladder wall. This irritation is manifested by increased frequency of urination, both day and night, and by an increase in the urge to urinate. In some, the urge to urinate is present almost always. Sometimes the urge is strong enough that you may not be able to stop yourself from urinating. The only real cure is to remove the stent and then give time for the bladder wall to heal which can't be done until the danger of the ureter swelling shut has passed, which varies. ° °You may see some blood in your urine while the stent is in place and a few days afterwards. Do not be alarmed, even if the urine was clear for a while. Get off your feet and drink lots of fluids until clearing occurs. If you start to pass clots or don't improve, call us. ° °DIET: °You may return to your normal diet immediately. Because of the raw surface of your bladder, alcohol, spicy foods, acid type foods and drinks with caffeine may cause irritation or frequency and should be used in moderation. To keep your urine flowing freely and to avoid constipation, drink plenty of fluids during the day ( 8-10 glasses ). °Tip: Avoid cranberry juice because it is very acidic. ° °ACTIVITY: °Your physical activity doesn't need to  be restricted. However, if you are very active, you may see some blood in your urine. We suggest that you reduce your activity under these circumstances until the bleeding has stopped. ° °BOWELS: °It is important to keep your bowels regular during the postoperative period. Straining with bowel movements can cause bleeding. A bowel movement every other day is reasonable. Use a mild laxative if needed, such as Milk of Magnesia 2-3 tablespoons, or 2 Dulcolax tablets. Call if you continue to have problems. If you have been taking narcotics for pain, before, during or after your surgery, you may be constipated. Take a laxative if necessary. ° ° °MEDICATION: °You should resume your pre-surgery medications unless told not to. In addition you will often be given an antibiotic to prevent infection. These should be taken as prescribed until the bottles are finished unless you are having an unusual reaction to one of the drugs. ° °PROBLEMS YOU SHOULD REPORT TO US: °· Fevers over 100.5 Fahrenheit. °· Heavy bleeding, or clots ( See above notes about blood in urine ). °· Inability to urinate. °· Drug reactions ( hives, rash, nausea, vomiting, diarrhea ). °· Severe burning or pain with urination that is not improving. ° °FOLLOW-UP: °You will need a follow-up   appointment to monitor your progress. Call for this appointment at the number listed above. Usually the first appointment will be about three to fourteen days after your surgery. ° ° ° ° ° °Post Anesthesia Home Care Instructions ° °Activity: °Get plenty of rest for the remainder of the day. A responsible adult should stay with you for 24 hours following the procedure.  °For the next 24 hours, DO NOT: °-Drive a car °-Operate machinery °-Drink alcoholic beverages °-Take any medication unless instructed by your physician °-Make any legal decisions or sign important papers. ° °Meals: °Start with liquid foods such as gelatin or soup. Progress to regular foods as tolerated. Avoid  greasy, spicy, heavy foods. If nausea and/or vomiting occur, drink only clear liquids until the nausea and/or vomiting subsides. Call your physician if vomiting continues. ° °Special Instructions/Symptoms: °Your throat may feel dry or sore from the anesthesia or the breathing tube placed in your throat during surgery. If this causes discomfort, gargle with warm salt water. The discomfort should disappear within 24 hours. ° °If you had a scopolamine patch placed behind your ear for the management of post- operative nausea and/or vomiting: ° °1. The medication in the patch is effective for 72 hours, after which it should be removed.  Wrap patch in a tissue and discard in the trash. Wash hands thoroughly with soap and water. °2. You may remove the patch earlier than 72 hours if you experience unpleasant side effects which may include dry mouth, dizziness or visual disturbances. °3. Avoid touching the patch. Wash your hands with soap and water after contact with the patch. °  ° °

## 2015-06-18 NOTE — Anesthesia Postprocedure Evaluation (Signed)
  Anesthesia Post-op Note  Patient: Margaret Phillips  Procedure(s) Performed: Procedure(s) (LRB): CYSTOSCOPY/RETROGRADE/STONE EXTRACTION WITH BASKET (Left) HOLMIUM LASER APPLICATION (Left) CYSTOSCOPY WITH STENT REPLACEMENT (Left) CYSTOSCOPY WITH URETEROSCOPY (Left)  Patient Location: PACU  Anesthesia Type: General  Level of Consciousness: awake and alert   Airway and Oxygen Therapy: Patient Spontanous Breathing  Post-op Pain: mild  Post-op Assessment: Post-op Vital signs reviewed, Patient's Cardiovascular Status Stable, Respiratory Function Stable, Patent Airway and No signs of Nausea or vomiting  Last Vitals:  Filed Vitals:   06/18/15 0902  BP: 155/63  Pulse: 88  Temp: 36.6 C  Resp: 11    Post-op Vital Signs: stable   Complications: No apparent anesthesia complications

## 2015-06-18 NOTE — Brief Op Note (Signed)
06/18/2015  8:55 AM  PATIENT:  Margaret Phillips  54 y.o. female  PRE-OPERATIVE DIAGNOSIS:  LEFT RENAL STONE  POST-OPERATIVE DIAGNOSIS:  LEFT RENAL STONE  PROCEDURE:  Procedure(s): CYSTOSCOPY/RETROGRADE/STONE EXTRACTION WITH BASKET (Left) HOLMIUM LASER APPLICATION (Left) CYSTOSCOPY WITH STENT REPLACEMENT (Left) CYSTOSCOPY WITH URETEROSCOPY (Left)  SURGEON:  Surgeon(s) and Role:    * Cleon Gustin, MD - Primary  PHYSICIAN ASSISTANT:   ASSISTANTS: none   ANESTHESIA:   general  EBL:  Total I/O In: 700 [I.V.:700] Out: -   BLOOD ADMINISTERED:none  DRAINS: Left 6x26 JJ stent  LOCAL MEDICATIONS USED:  NONE  SPECIMEN:  Source of Specimen:  stone  DISPOSITION OF SPECIMEN:  NA  COUNTS:  YES  TOURNIQUET:  * No tourniquets in log *  DICTATION: .Note written in EPIC  PLAN OF CARE: Discharge to home after PACU  PATIENT DISPOSITION:  PACU - hemodynamically stable.   Delay start of Pharmacological VTE agent (>24hrs) due to surgical blood loss or risk of bleeding: no

## 2015-06-18 NOTE — Anesthesia Preprocedure Evaluation (Signed)
Anesthesia Evaluation  Patient identified by MRN, date of birth, ID band Patient awake    Reviewed: Allergy & Precautions, NPO status , Patient's Chart, lab work & pertinent test results  Airway Mallampati: II  TM Distance: >3 FB Neck ROM: Full    Dental no notable dental hx.    Pulmonary sleep apnea , PE breath sounds clear to auscultation  Pulmonary exam normal       Cardiovascular negative cardio ROS Normal cardiovascular examRhythm:Regular Rate:Normal     Neuro/Psych negative neurological ROS  negative psych ROS   GI/Hepatic negative GI ROS, Neg liver ROS,   Endo/Other  Morbid obesity  Renal/GU negative Renal ROS  negative genitourinary   Musculoskeletal negative musculoskeletal ROS (+)   Abdominal   Peds negative pediatric ROS (+)  Hematology negative hematology ROS (+)   Anesthesia Other Findings   Reproductive/Obstetrics negative OB ROS                             Anesthesia Physical Anesthesia Plan  ASA: III  Anesthesia Plan: General   Post-op Pain Management:    Induction: Intravenous  Airway Management Planned: LMA  Additional Equipment:   Intra-op Plan:   Post-operative Plan: Extubation in OR  Informed Consent: I have reviewed the patients History and Physical, chart, labs and discussed the procedure including the risks, benefits and alternatives for the proposed anesthesia with the patient or authorized representative who has indicated his/her understanding and acceptance.   Dental advisory given  Plan Discussed with: CRNA and Surgeon  Anesthesia Plan Comments:         Anesthesia Quick Evaluation

## 2015-06-18 NOTE — Anesthesia Procedure Notes (Signed)
Procedure Name: LMA Insertion Date/Time: 06/18/2015 7:39 AM Performed by: Mechele Claude Pre-anesthesia Checklist: Patient identified, Emergency Drugs available, Suction available and Patient being monitored Patient Re-evaluated:Patient Re-evaluated prior to inductionOxygen Delivery Method: Circle System Utilized Preoxygenation: Pre-oxygenation with 100% oxygen Intubation Type: IV induction Ventilation: Mask ventilation without difficulty LMA: LMA with gastric port inserted LMA Size: 4.0 Number of attempts: 1 Placement Confirmation: positive ETCO2 Tube secured with: Tape Dental Injury: Teeth and Oropharynx as per pre-operative assessment

## 2015-06-18 NOTE — Op Note (Signed)
Preoperative diagnosis: Left renal stone  Postoperative diagnosis: Same  Procedure: 1 cystoscopy 2. Left ureteroscopic stone manipulation with laser lithotripsy 3  Left 6 x 26 JJ stent exchange  Attending: Rosie Fate  Anesthesia: General  Estimated blood loss: None  Drains: Left 6 x 26 JJ ureteral stent without tether  Specimens: stone  Antibiotics: rocephin  Findings: 2 left UPJ stones. No hydronephrosis. No masses/lesions in the bladder. Ureteral orifices in normal anatomic location.  Indications: Patient is a 54 year old female with a history of left renal stone and who has persistent left flank pain. She already has a stent in place  After discussing treatment options, she decided proceed with left ureteroscopic stone manipulation.  Procedure her in detail: The patient was brought to the operating room and a brief timeout was done to ensure correct patient, correct procedure, correct site.  General anesthesia was administered patient was placed in dorsal lithotomy position. Their genitalia was then prepped and draped in usual sterile fashion.  A rigid 20 French cystoscope was passed in the urethra and the bladder.  Bladder was inspected free masses or lesions.  the ureteral orifices were in the normal orthotopic locations.  Using a grasper the stent was brought to the urethral meatus.  we then placed a zip wire through the stent and advanced up to the renal pelvis.  we then removed the cystoscope and cannulated the left ureteral orifice with a semirigid ureteroscope.  No stone was found in the ureter. Once we reached the UPJ a sensor wire was advanced in to the renal pelvis. We then removed the ureteroscope and advanced a 12/14 x 38 access sheath up to the renal pelvis. the flexible ureteroscopewas then used to perform nephroscopy. We encountered the 2 stones in the renal pelvis/UPJ.  using using a 200 nm laser fiber and fragmented the stone into smaller pieces.  the pieces were  then removed with a Ngage basket. We removed as much stone as visible but visualization was impaired due to the patient being on coumadin. We then removed the access sheath under direct vision and no injury to the ureter was noted. we then placed a 6 x 26 double-j ureteral stent over the original zip wire.  We then removed the wire and good coil was noted in the the renal pelvis under fluoroscopy and the bladder under direct vision.  the bladder was then drained and this concluded the procedure which was well tolerated by patient.  Complications: None  Condition: Stable, extubated, transferred to PACU  Plan: Patient is to be discharged home as to follow-up in one week for KUB and possible stent removal

## 2015-06-18 NOTE — Transfer of Care (Signed)
Last Vitals:  Filed Vitals:   06/18/15 0629  BP: 150/82  Pulse: 85  Temp: 36.5 C  Resp: 16    Immediate Anesthesia Transfer of Care Note  Patient: Margaret Phillips  Procedure(s) Performed: Procedure(s) (LRB): CYSTOSCOPY/RETROGRADE/STONE EXTRACTION WITH BASKET (Left) HOLMIUM LASER APPLICATION (Left) CYSTOSCOPY WITH STENT REPLACEMENT (Left) CYSTOSCOPY WITH URETEROSCOPY (Left)  Patient Location: PACU  Anesthesia Type: General  Level of Consciousness: awake, alert  and oriented  Airway & Oxygen Therapy: Patient Spontanous Breathing and Patient connected to face mask oxygen  Post-op Assessment: Report given to PACU RN and Post -op Vital signs reviewed and stable  Post vital signs: Reviewed and stable  Complications: No apparent anesthesia complications

## 2015-06-19 ENCOUNTER — Encounter (HOSPITAL_BASED_OUTPATIENT_CLINIC_OR_DEPARTMENT_OTHER): Payer: Self-pay | Admitting: Urology

## 2015-06-23 NOTE — H&P (Signed)
Urology Admission H&P  Chief Complaint: left flank pain  History of Present Illness: Margaret Phillips is a 54yo with a hx of left UPJ stone here for left stone extraction. She underwent left ureteral stenting 2 weeks ago for refractory left flank pain. SHe is currently on coumadin. SHe has moderate LUTS with the stent in place. She denies any fevers/chills/sweats.  Past Medical History  Diagnosis Date  . Factor V Leiden   . History of pulmonary embolism     1987  &  1998  . Renal calculus, left   . History of nephrolithiasis   . Hyperlipidemia   . History of DVT of lower extremity   . PTSD (post-traumatic stress disorder)     NIGHT TERRORS  . Mild obstructive sleep apnea     per pt study done 2006 (approx.)  mild OSA no cpap recommended   Past Surgical History  Procedure Laterality Date  . Cystoscopy with retrograde pyelogram, ureteroscopy and stent placement Left 06/04/2015    Procedure: CYSTOSCOPY WITH RETROGRADE PYELOGRAM,  AND STENT PLACEMENT;  Surgeon: Cleon Gustin, MD;  Location: [facility];  Service: Urology;  Laterality: Left;  . Cervical fusion  1997    C6 - 7  . Cystoscopy/retrograde/ureteroscopy/stone extraction with basket Left 06/18/2015    Procedure: CYSTOSCOPY/RETROGRADE/STONE EXTRACTION WITH BASKET;  Surgeon: Cleon Gustin, MD;  Location: [facility];  Service: Urology;  Laterality: Left;  . Holmium laser application Left 10/25/1476    Procedure: HOLMIUM LASER APPLICATION;  Surgeon: Cleon Gustin, MD;  Location: [facility];  Service: Urology;  Laterality: Left;  . Cystoscopy w/ ureteral stent placement Left 06/18/2015    Procedure: CYSTOSCOPY WITH STENT REPLACEMENT;  Surgeon: Cleon Gustin, MD;  Location: [facility];  Service: Urology;  Laterality: Left;  . Cystoscopy with ureteroscopy Left 06/18/2015    Procedure: CYSTOSCOPY WITH URETEROSCOPY;  Surgeon: Cleon Gustin, MD;  Location: [facility];   Service: Urology;  Laterality: Left;    Home Medications:  No prescriptions prior to admission   Allergies:  Allergies  Allergen Reactions  . Betadine [Povidone Iodine] Hives  . Contrast Media [Iodinated Diagnostic Agents] Anaphylaxis  . Iodine Anaphylaxis and Hives    History reviewed. No pertinent family history. Social History:  reports that she has never smoked. She has never used smokeless tobacco. She reports that she drinks about 1.8 oz of alcohol per week. She reports that she does not use illicit drugs.  Review of Systems  Genitourinary: Positive for frequency, hematuria and flank pain.  All other systems reviewed and are negative.   Physical Exam:  Vital signs in last 24 hours:   Physical Exam  Constitutional: She is oriented to person, place, and time. She appears well-developed and well-nourished.  HENT:  Head: Normocephalic and atraumatic.  Eyes: EOM are normal. Pupils are equal, round, and reactive to light.  Neck: Normal range of motion. No thyromegaly present.  Cardiovascular: Normal rate and regular rhythm.   Respiratory: Effort normal. No respiratory distress.  GI: Soft. She exhibits no distension.  Musculoskeletal: Normal range of motion.  Neurological: She is alert and oriented to person, place, and time.  Skin: Skin is warm and dry.  Psychiatric: She has a normal mood and affect. Her behavior is normal. Judgment and thought content normal.    Laboratory Data:  No results found for this or any previous visit (from the past 24 hour(s)). No results found for this or any  previous visit (from the past 240 hour(s)). Creatinine: No results for input(s): CREATININE in the last 168 hours.   Impression/Assessment:  Left UPJ stone  Plan:  The risks/benefits/alternatives to left ureteroscopic stone extraction was explained to the patient and she understands and wishes to proceed with surgery.  Kendyl Bissonnette L 06/23/2015, 10:33 PM

## 2015-06-24 ENCOUNTER — Encounter (HOSPITAL_COMMUNITY): Payer: Self-pay | Admitting: Emergency Medicine

## 2015-06-24 ENCOUNTER — Observation Stay (HOSPITAL_COMMUNITY)
Admission: EM | Admit: 2015-06-24 | Discharge: 2015-06-25 | Disposition: A | Discharge status: Home / Self Care | Payer: Commercial Managed Care - PPO | Attending: Internal Medicine | Admitting: Internal Medicine

## 2015-06-24 DIAGNOSIS — R319 Hematuria, unspecified: Secondary | ICD-10-CM | POA: Diagnosis not present

## 2015-06-24 DIAGNOSIS — Z87442 Personal history of urinary calculi: Secondary | ICD-10-CM | POA: Insufficient documentation

## 2015-06-24 DIAGNOSIS — N200 Calculus of kidney: Secondary | ICD-10-CM | POA: Insufficient documentation

## 2015-06-24 DIAGNOSIS — D6851 Activated protein C resistance: Secondary | ICD-10-CM | POA: Diagnosis not present

## 2015-06-24 DIAGNOSIS — Z7901 Long term (current) use of anticoagulants: Secondary | ICD-10-CM | POA: Insufficient documentation

## 2015-06-24 DIAGNOSIS — R112 Nausea with vomiting, unspecified: Secondary | ICD-10-CM

## 2015-06-24 DIAGNOSIS — Z86711 Personal history of pulmonary embolism: Secondary | ICD-10-CM | POA: Diagnosis not present

## 2015-06-24 DIAGNOSIS — R509 Fever, unspecified: Secondary | ICD-10-CM | POA: Insufficient documentation

## 2015-06-24 DIAGNOSIS — N1330 Unspecified hydronephrosis: Principal | ICD-10-CM | POA: Insufficient documentation

## 2015-06-24 DIAGNOSIS — R109 Unspecified abdominal pain: Secondary | ICD-10-CM

## 2015-06-24 DIAGNOSIS — N2889 Other specified disorders of kidney and ureter: Secondary | ICD-10-CM | POA: Diagnosis present

## 2015-06-24 DIAGNOSIS — R1084 Generalized abdominal pain: Secondary | ICD-10-CM

## 2015-06-24 MED ORDER — HYDROMORPHONE HCL 1 MG/ML IJ SOLN: 1.0000 mg | INTRAMUSCULAR | Status: DC | PRN

## 2015-06-24 MED ORDER — MORPHINE SULFATE (PF) 2 MG/ML IV SOLN
2.0000 mg | Freq: Once | INTRAVENOUS | Status: DC
Start: 2015-06-24 — End: 2015-06-24

## 2015-06-24 MED ORDER — SODIUM CHLORIDE 0.9 % IV BOLUS (SEPSIS)
1000.0000 mL | Freq: Once | INTRAVENOUS | Status: AC
  Administered 2015-06-24: 1000 mL via INTRAVENOUS

## 2015-06-24 MED ORDER — ONDANSETRON HCL 4 MG/2ML IJ SOLN
4.0000 mg | Freq: Once | INTRAMUSCULAR | Status: AC
  Administered 2015-06-24: 4 mg via INTRAVENOUS
  Filled 2015-06-24: qty 2

## 2015-06-24 MED ORDER — ACETAMINOPHEN 650 MG RE SUPP
650.0000 mg | Freq: Once | RECTAL | Status: AC
  Administered 2015-06-24: 650 mg via RECTAL
  Filled 2015-06-24: qty 1

## 2015-06-24 MED ORDER — HYDROMORPHONE HCL 1 MG/ML IJ SOLN
1.0000 mg | Freq: Once | INTRAMUSCULAR | Status: AC
  Administered 2015-06-24: 1 mg via INTRAVENOUS
  Filled 2015-06-24: qty 1

## 2015-06-24 NOTE — ED Notes (Signed)
Per [facility] pt from home, had recent surgery for kidney stones removal  on Thursday. Pt co hematuria that is more than usual post surgery  per pt. Pt takes coumadin and Toradol for pain. Pt appears in distress and reported lower  abd cramping  ems. Hx of PE , last one 1999.

## 2015-06-24 NOTE — ED Notes (Signed)
Bed: [facility] Expected date:  Expected time:  Means of arrival:  Comments: N,v chills, s/p urinary procedure

## 2015-06-24 NOTE — ED Provider Notes (Signed)
CSN: [id number]     Arrival date & time 06/24/15  2203 History   First MD Initiated Contact with Patient 06/24/15 2216     Chief Complaint  Patient presents with  . Hematuria     (Consider location/radiation/quality/duration/timing/severity/associated sxs/prior Treatment) The history is provided by the patient and the spouse.    Patient is a 54 year old female with pertinent pmhx of kidney stone, factor V Leiden, PE chronically anticoagulated on Coumadin, presents to the emergency department with gradually increasing and severe abdominal pain. Her left flank to her "bladder," with associated vomiting, fever, and headache.  Last month she was diagnosed with several kidney stones.  She is a patient of Dr. Alyson Ingles.  On August 18 she had a left stent placed for left ureteral pelvic junction stone.  6 days ago she underwent lithotripsy with stent replacement.  2 days after the procedure she had increasing pain, dysuria, hematuria with large blood clots, and fever. She called urology and was called in a prescription of Keflex. She continued to have increasing pain and persistent fevers despite compliance with Keflex.  2 days ago she was evaluated in the [facility] office, and she did give a urine sample, had a renal ultrasound and she is scheduled to see urology tomorrow.  The patient had such severe pain in her abdomen today, with vomiting, persistent fever, and pounding headache, so she presents to the ER tonight for evaluation.  Her pain is rated at 10 out 10, most intense in her low central abdomen and exacerbated with micturition.  She has vomited multiple times tonight.  She is experiencing chest and epigastric pain, also left flank pain and generalized left abdomen pain.  She has been compliant with her Flomax, and biotics, received a Toradol shot in the office yesterday without any improvement.    Past Medical History  Diagnosis Date  . Factor V Leiden   . History of pulmonary embolism      1987  &  1998  . Renal calculus, left   . History of nephrolithiasis   . Hyperlipidemia   . History of DVT of lower extremity   . PTSD (post-traumatic stress disorder)     NIGHT TERRORS  . Mild obstructive sleep apnea     per pt study done 2006 (approx.)  mild OSA no cpap recommended   Past Surgical History  Procedure Laterality Date  . Cystoscopy with retrograde pyelogram, ureteroscopy and stent placement Left 06/04/2015    Procedure: CYSTOSCOPY WITH RETROGRADE PYELOGRAM,  AND STENT PLACEMENT;  Surgeon: Cleon Gustin, MD;  Location: [facility];  Service: Urology;  Laterality: Left;  . Cervical fusion  1997    C6 - 7  . Cystoscopy/retrograde/ureteroscopy/stone extraction with basket Left 06/18/2015    Procedure: CYSTOSCOPY/RETROGRADE/STONE EXTRACTION WITH BASKET;  Surgeon: Cleon Gustin, MD;  Location: [facility];  Service: Urology;  Laterality: Left;  . Holmium laser application Left 05/17/174    Procedure: HOLMIUM LASER APPLICATION;  Surgeon: Cleon Gustin, MD;  Location: [facility];  Service: Urology;  Laterality: Left;  . Cystoscopy w/ ureteral stent placement Left 06/18/2015    Procedure: CYSTOSCOPY WITH STENT REPLACEMENT;  Surgeon: Cleon Gustin, MD;  Location: [facility];  Service: Urology;  Laterality: Left;  . Cystoscopy with ureteroscopy Left 06/18/2015    Procedure: CYSTOSCOPY WITH URETEROSCOPY;  Surgeon: Cleon Gustin, MD;  Location: [facility];  Service: Urology;  Laterality: Left;   No family history on  file. Social History  Substance Use Topics  . Smoking status: Never Smoker   . Smokeless tobacco: Never Used  . Alcohol Use: 1.8 oz/week    3 Glasses of wine per week     Comment: weekends only   OB History    No data available     Review of Systems  Constitutional: Positive for fever and diaphoresis. Negative for chills and fatigue.  Eyes: Negative.   Respiratory: Negative.    Cardiovascular: Negative.   Gastrointestinal: Positive for nausea and vomiting.  Genitourinary: Positive for dysuria, urgency, hematuria, flank pain and pelvic pain.  Musculoskeletal: Negative.   Skin: Negative.   Neurological: Positive for headaches. Negative for syncope and weakness.  Hematological: Negative.   Psychiatric/Behavioral: Negative.    Allergies  Betadine; Contrast media; and Iodine  Home Medications   Prior to Admission medications   Medication Sig Start Date End Date Taking? Authorizing Provider  acetaminophen (TYLENOL) 500 MG tablet Take 1,000 mg by mouth every 6 (six) hours as needed for fever.   Yes Historical Provider, MD  atorvastatin (LIPITOR) 10 MG tablet Take 10 mg by mouth at bedtime. 05/18/15  Yes Historical Provider, MD  cephALEXin (KEFLEX) 500 MG capsule Take 1 capsule by mouth 3 (three) times daily. 06/21/15  Yes Historical Provider, MD  clonazePAM (KLONOPIN) 0.5 MG tablet Take 0.5 mg by mouth 2 (two) times daily as needed for anxiety.   Yes Historical Provider, MD  Meth-Hyo-M Bl-Na Phos-Ph Sal (URIBEL) 118 MG CAPS Take 1 capsule by mouth daily as needed (discomfort).   Yes Historical Provider, MD  ondansetron (ZOFRAN ODT) 4 MG disintegrating tablet Take 1 tablet (4 mg total) by mouth every 8 (eight) hours as needed for nausea or vomiting. 06/18/15  Yes Cleon Gustin, MD  oxybutynin (DITROPAN-XL) 10 MG 24 hr tablet Take 1 tablet (10 mg total) by mouth every morning. 06/18/15  Yes Cleon Gustin, MD  oxyCODONE-acetaminophen (ROXICET) 5-325 MG per tablet Take 1 tablet by mouth every 6 (six) hours as needed for severe pain. 06/18/15  Yes Cleon Gustin, MD  prazosin (MINIPRESS) 2 MG capsule Take 2 mg by mouth at bedtime.  05/18/15  Yes Historical Provider, MD  promethazine (PHENERGAN) 25 MG tablet Take 25 mg by mouth every 6 (six) hours as needed for nausea or vomiting.   Yes Historical Provider, MD  sertraline (ZOLOFT) 100 MG tablet Take 100 mg by mouth at  bedtime.   Yes Historical Provider, MD  tamsulosin (FLOMAX) 0.4 MG CAPS capsule Take 1 capsule (0.4 mg total) by mouth daily. 06/18/15  Yes Cleon Gustin, MD  warfarin (COUMADIN) 5 MG tablet Take 10-15 mg by mouth daily. Take 15 mg MWF & 10 mg on all other days   Yes Historical Provider, MD   BP 135/64 mmHg  Pulse 82  Temp(Src) 98.4 F (36.9 C) (Oral)  Resp 17  SpO2 91%  LMP 07/17/2014 (Within Months) Physical Exam  Constitutional: She is oriented to person, place, and time. She appears well-developed and well-nourished. She appears distressed.  Obese female, appears stated age, appears to be significant amount of pain, writhing around in bed, screaming, crying and vomiting  HENT:  Head: Normocephalic and atraumatic.  Nose: Nose normal.  Mouth/Throat: Oropharynx is clear and moist. No oropharyngeal exudate.  Eyes: Conjunctivae and EOM are normal. Pupils are equal, round, and reactive to light. Right eye exhibits no discharge. Left eye exhibits no discharge. No scleral icterus.  Neck: Normal range of motion. No  JVD present. No tracheal deviation present. No thyromegaly present.  Cardiovascular: Normal rate, regular rhythm, normal heart sounds and intact distal pulses.  Exam reveals no gallop and no friction rub.   No murmur heard. Pulmonary/Chest: Effort normal. No accessory muscle usage. Tachypnea noted. No respiratory distress. She has decreased breath sounds. She has no wheezes. She has no rhonchi. She has no rales. She exhibits no tenderness.  Abdominal: Soft. Bowel sounds are normal. She exhibits no distension and no mass. There is tenderness. There is rebound and guarding.  TTP LUQ, LLQ, suprapubic, left CVA  Musculoskeletal: Normal range of motion. She exhibits no tenderness.  Lymphadenopathy:    She has no cervical adenopathy.  Neurological: She is alert and oriented to person, place, and time. She exhibits normal muscle tone. Coordination normal.  Skin: Skin is warm. No rash  noted. She is diaphoretic. No erythema. No pallor.  Psychiatric: She has a normal mood and affect. Judgment and thought content normal.  Nursing note and vitals reviewed.     ED Course  Procedures (including critical care time) Labs Review Labs Reviewed  CBC WITH DIFFERENTIAL/PLATELET - Abnormal; Notable for the following:    RBC 3.85 (*)    Hemoglobin 11.5 (*)    HCT 35.4 (*)    All other components within normal limits  PROTIME-INR - Abnormal; Notable for the following:    Prothrombin Time 24.2 (*)    INR 2.19 (*)    All other components within normal limits  APTT - Abnormal; Notable for the following:    aPTT 43 (*)    All other components within normal limits  COMPREHENSIVE METABOLIC PANEL - Abnormal; Notable for the following:    Glucose, Bld 114 (*)    All other components within normal limits  URINALYSIS, ROUTINE W REFLEX MICROSCOPIC (NOT AT [facility]) - Abnormal; Notable for the following:    Color, Urine RED (*)    APPearance TURBID (*)    Hgb urine dipstick LARGE (*)    Bilirubin Urine MODERATE (*)    Ketones, ur 15 (*)    Protein, ur 100 (*)    Nitrite POSITIVE (*)    Leukocytes, UA LARGE (*)    All other components within normal limits  URINE CULTURE  CULTURE, BLOOD (ROUTINE X 2)  CULTURE, BLOOD (ROUTINE X 2)  URINE MICROSCOPIC-ADD ON  I-STAT CHEM 8, ED  I-STAT CG4 LACTIC ACID, ED    Imaging Review Ct Renal Stone Study  06/25/2015   CLINICAL DATA:  Left flank pain. Increasing hematuria since surgery on 06/18/2015 for left kidney stones. History of ureteral stents and multiple cystoscopies.  EXAM: CT ABDOMEN AND PELVIS WITHOUT CONTRAST  TECHNIQUE: Multidetector CT imaging of the abdomen and pelvis was performed following the standard protocol without IV contrast.  COMPARISON:  None.  FINDINGS: Dependent atelectasis in the lung bases.  Multiple stone fragments are demonstrated in the left renal collecting system. There is a left ureteral stent with proximal pigtail  low in the renal pelvis and distal pigtail in the bladder. There is mild hydronephrosis suggesting possible poor stent function. The urine in the left intrarenal collecting system is of increased density. This may be due to residual contrast material or hemorrhage. Infiltration along the course of the left ureter may be due to inflammation or persistent changes related to previous obstruction. Nonobstructing stones demonstrated in the right kidney. Right ureter is decompressed. Bladder is decompressed.  Unenhanced appearance of the liver, spleen, gallbladder, pancreas, adrenal glands, abdominal aorta,  inferior vena cava, and retroperitoneal lymph nodes is unremarkable. Stomach, small bowel, and colon are not abnormally distended. No free air or free fluid in the abdomen.  Pelvis: Uterus and ovaries are not enlarged. No free or loculated pelvic fluid collections. No pelvic mass or lymphadenopathy.  IMPRESSION: Left ureteral stent with mild left hydronephrosis suggesting possible stent malfunction. The proximal pigtail of the stent is low in the renal pelvis, at or just below the ureteropelvic junction. Increased density of the urine in the left intrarenal collecting system suggesting residual contrast material or hemorrhage. Multiple residual stone fragments in the left kidney. Nonobstructing stone in the right kidney.   Electronically Signed   By: Lucienne Capers M.D.   On: 06/25/2015 01:20   I have personally reviewed and evaluated these images and lab results as part of my medical decision-making.   EKG Interpretation None      MDM   Final diagnoses:  Renal hemorrhage, left  Left flank pain    Pt with increased pain with fever after lithotripsy, not improved after 4d keflex, toradol shot in urology office, flomax, home pain meds.  Presented with increased pain and vomiting  Basic labs, UA, renal stone study Pt had a rectal temp of 100.2, responded well to tylenol Pt has pain improved with IV  dilaudid The patient's coags show a INR 2.19, otherwise labs unremarkable Urinalysis is positive for nitrites and leukocytes Renal stone study shows a left ureteral stent with mild left hydronephrosis, multiple residual stone fragments left kidney, nonobstructing stones and right kidney  Patient continued to have a headache was given a headache cocktail of Benadryl and Reglan  Dr. Meriam Sprague was consulted for urology, who reviewed a urinalysis and renal stone study, he was also able to review results of the urine culture done several days ago in his office, which had no growth. He states this is likely stent pain, she is scheduled for stent removal tomorrow and believes this will resolve her symptoms. He does not believe the urinalysis is concerning and recommends mp antibiotic changes.  If we are able to control her pain tonight she can go home and follow-up in the morning with Dr. Alyson Ingles  Pt reports significantly improved headache, abdominal pain, and she has not had any further vomiting in the ER. She feels like she can go home and sleep and get to her appointment at 10 am with Dr. Alyson Ingles.  I relayed my conversation with Dr. Alinda Money to them.  The pt and family at the bedside agreed that she could go home tonight.  Filed Vitals:   06/24/15 2224 06/24/15 2344 06/25/15 0001 06/25/15 0137  BP: 142/60  135/64   Pulse: 102  82   Temp: 99.4 F (37.4 C) 100.2 F (37.9 C)  98.4 F (36.9 C)  TempSrc: Oral Rectal  Oral  Resp: 24  17   SpO2: 93%  91%    Medications  sodium chloride 0.9 % bolus 1,000 mL (0 mLs Intravenous Stopped 06/25/15 0137)  HYDROmorphone (DILAUDID) injection 1 mg (1 mg Intravenous Given 06/24/15 2324)  ondansetron (ZOFRAN) injection 4 mg (4 mg Intravenous Given 06/24/15 2324)  acetaminophen (TYLENOL) suppository 650 mg (650 mg Rectal Given 06/24/15 2324)  diphenhydrAMINE (BENADRYL) injection 25 mg (25 mg Intravenous Given 06/25/15 0138)  metoCLOPramide (REGLAN) injection 10 mg (10 mg  Intravenous Given 06/25/15 0138)  HYDROmorphone (DILAUDID) injection 0.5 mg (0.5 mg Intravenous Given 06/25/15 0137)         Delsa Grana, PA-C 06/25/15 0302  Forde Dandy, MD 06/25/15 [phone]

## 2015-06-25 ENCOUNTER — Other Ambulatory Visit: Payer: Self-pay | Admitting: Urology

## 2015-06-25 ENCOUNTER — Observation Stay (HOSPITAL_COMMUNITY): Payer: Commercial Managed Care - PPO

## 2015-06-25 LAB — CBC WITH DIFFERENTIAL/PLATELET
BASOS ABS: 0 10*3/uL (ref 0.0–0.1)
BASOS PCT: 0 % (ref 0–1)
Eosinophils Absolute: 0.1 10*3/uL (ref 0.0–0.7)
Eosinophils Relative: 1 % (ref 0–5)
HEMATOCRIT: 35.4 % — AB (ref 36.0–46.0)
HEMOGLOBIN: 11.5 g/dL — AB (ref 12.0–15.0)
LYMPHS PCT: 14 % (ref 12–46)
Lymphs Abs: 1.1 10*3/uL (ref 0.7–4.0)
MCH: 29.9 pg (ref 26.0–34.0)
MCHC: 32.5 g/dL (ref 30.0–36.0)
MCV: 91.9 fL (ref 78.0–100.0)
MONOS PCT: 8 % (ref 3–12)
Monocytes Absolute: 0.7 10*3/uL (ref 0.1–1.0)
NEUTROS ABS: 6 10*3/uL (ref 1.7–7.7)
NEUTROS PCT: 77 % (ref 43–77)
Platelets: 211 10*3/uL (ref 150–400)
RBC: 3.85 MIL/uL — ABNORMAL LOW (ref 3.87–5.11)
RDW: 13.4 % (ref 11.5–15.5)
WBC: 7.8 10*3/uL (ref 4.0–10.5)

## 2015-06-25 LAB — URINALYSIS, ROUTINE W REFLEX MICROSCOPIC
GLUCOSE, UA: NEGATIVE mg/dL
Ketones, ur: 15 mg/dL — AB
Nitrite: POSITIVE — AB
PH: 7.5 (ref 5.0–8.0)
PROTEIN: 100 mg/dL — AB
SPECIFIC GRAVITY, URINE: 1.022 (ref 1.005–1.030)
Urobilinogen, UA: 1 mg/dL (ref 0.0–1.0)

## 2015-06-25 LAB — COMPREHENSIVE METABOLIC PANEL
ALBUMIN: 3.8 g/dL (ref 3.5–5.0)
ALT: 19 U/L (ref 14–54)
AST: 24 U/L (ref 15–41)
Alkaline Phosphatase: 53 U/L (ref 38–126)
Anion gap: 8 (ref 5–15)
BUN: 19 mg/dL (ref 6–20)
CHLORIDE: 105 mmol/L (ref 101–111)
CO2: 23 mmol/L (ref 22–32)
Calcium: 9 mg/dL (ref 8.9–10.3)
Creatinine, Ser: 1 mg/dL (ref 0.44–1.00)
GFR calc Af Amer: >60 mL/min (ref >60)
GFR calc non Af Amer: >60 mL/min (ref >60)
GLUCOSE: 114 mg/dL — AB (ref 65–99)
POTASSIUM: 4.5 mmol/L (ref 3.5–5.1)
Sodium: 136 mmol/L (ref 135–145)
Total Bilirubin: 0.5 mg/dL (ref 0.3–1.2)
Total Protein: 7.2 g/dL (ref 6.5–8.1)

## 2015-06-25 LAB — URINE MICROSCOPIC-ADD ON

## 2015-06-25 LAB — I-STAT CG4 LACTIC ACID, ED: Lactic Acid, Venous: 0.62 mmol/L (ref 0.5–2.0)

## 2015-06-25 LAB — PROTIME-INR
INR: 2.19 — AB (ref 0.00–1.49)
Prothrombin Time: 24.2 seconds — ABNORMAL HIGH (ref 11.6–15.2)

## 2015-06-25 LAB — APTT: APTT: 43 s — AB (ref 24–37)

## 2015-06-25 MED ORDER — HYDROMORPHONE HCL 1 MG/ML IJ SOLN
0.5000 mg | Freq: Once | INTRAMUSCULAR | Status: AC
  Administered 2015-06-25: 0.5 mg via INTRAVENOUS
  Filled 2015-06-25: qty 1

## 2015-06-25 MED ORDER — DIPHENHYDRAMINE HCL 50 MG/ML IJ SOLN
25.0000 mg | Freq: Once | INTRAMUSCULAR | Status: AC
  Administered 2015-06-25: 25 mg via INTRAVENOUS
  Filled 2015-06-25: qty 1

## 2015-06-25 MED ORDER — METOCLOPRAMIDE HCL 5 MG/ML IJ SOLN
10.0000 mg | Freq: Once | INTRAMUSCULAR | Status: AC
  Administered 2015-06-25: 10 mg via INTRAVENOUS
  Filled 2015-06-25: qty 2

## 2015-06-25 NOTE — Discharge Instructions (Signed)

## 2015-06-26 LAB — URINE CULTURE: CULTURE: NO GROWTH

## 2015-06-30 LAB — CULTURE, BLOOD (ROUTINE X 2)
CULTURE: NO GROWTH
CULTURE: NO GROWTH

## 2015-07-06 ENCOUNTER — Encounter (HOSPITAL_BASED_OUTPATIENT_CLINIC_OR_DEPARTMENT_OTHER): Payer: Self-pay | Admitting: *Deleted

## 2015-07-06 NOTE — Progress Notes (Signed)
NPO AFTER MN. ARRIVE AT 0630.  CURRENT LAB RESULTS IN CHART.  WILL TAKE VESICARE , CLONAZEPAM, AND FLOMAX AM DOS W/ SIPS OF WATER.

## 2015-07-13 ENCOUNTER — Ambulatory Visit (HOSPITAL_BASED_OUTPATIENT_CLINIC_OR_DEPARTMENT_OTHER): Payer: Commercial Managed Care - PPO | Admitting: Anesthesiology

## 2015-07-13 ENCOUNTER — Encounter (HOSPITAL_BASED_OUTPATIENT_CLINIC_OR_DEPARTMENT_OTHER): Payer: Self-pay | Admitting: *Deleted

## 2015-07-13 ENCOUNTER — Ambulatory Visit (HOSPITAL_BASED_OUTPATIENT_CLINIC_OR_DEPARTMENT_OTHER)
Admission: RE | Admit: 2015-07-13 | Discharge: 2015-07-13 | Disposition: A | Discharge status: Home / Self Care | Payer: Commercial Managed Care - PPO | Source: Ambulatory Visit | Attending: Urology | Admitting: Urology

## 2015-07-13 ENCOUNTER — Encounter (HOSPITAL_BASED_OUTPATIENT_CLINIC_OR_DEPARTMENT_OTHER)
Admission: RE | Disposition: A | Discharge status: Home / Self Care | Payer: Self-pay | Source: Ambulatory Visit | Attending: Urology

## 2015-07-13 DIAGNOSIS — Z86711 Personal history of pulmonary embolism: Secondary | ICD-10-CM | POA: Diagnosis not present

## 2015-07-13 DIAGNOSIS — Z86718 Personal history of other venous thrombosis and embolism: Secondary | ICD-10-CM | POA: Insufficient documentation

## 2015-07-13 DIAGNOSIS — E785 Hyperlipidemia, unspecified: Secondary | ICD-10-CM | POA: Insufficient documentation

## 2015-07-13 DIAGNOSIS — D6851 Activated protein C resistance: Secondary | ICD-10-CM | POA: Insufficient documentation

## 2015-07-13 DIAGNOSIS — G4733 Obstructive sleep apnea (adult) (pediatric): Secondary | ICD-10-CM | POA: Diagnosis not present

## 2015-07-13 DIAGNOSIS — F4310 Post-traumatic stress disorder, unspecified: Secondary | ICD-10-CM | POA: Insufficient documentation

## 2015-07-13 DIAGNOSIS — R109 Unspecified abdominal pain: Secondary | ICD-10-CM | POA: Diagnosis present

## 2015-07-13 DIAGNOSIS — Z87442 Personal history of urinary calculi: Secondary | ICD-10-CM | POA: Diagnosis not present

## 2015-07-13 DIAGNOSIS — N202 Calculus of kidney with calculus of ureter: Secondary | ICD-10-CM | POA: Insufficient documentation

## 2015-07-13 DIAGNOSIS — Z7901 Long term (current) use of anticoagulants: Secondary | ICD-10-CM | POA: Insufficient documentation

## 2015-07-13 HISTORY — PX: CYSTOSCOPY WITH RETROGRADE PYELOGRAM, URETEROSCOPY AND STENT PLACEMENT: SHX5789

## 2015-07-13 SURGERY — CYSTOURETEROSCOPY, WITH RETROGRADE PYELOGRAM AND STENT INSERTION
Anesthesia: General | Laterality: Left

## 2015-07-13 MED ORDER — FENTANYL CITRATE (PF) 100 MCG/2ML IJ SOLN
INTRAMUSCULAR | Status: AC
  Filled 2015-07-13: qty 4

## 2015-07-13 MED ORDER — IOHEXOL 350 MG/ML SOLN
INTRAVENOUS | Status: DC | PRN
  Administered 2015-07-13: 2 mL via URETHRAL

## 2015-07-13 MED ORDER — PROPOFOL 10 MG/ML IV BOLUS
INTRAVENOUS | Status: DC | PRN
  Administered 2015-07-13: 200 mg via INTRAVENOUS

## 2015-07-13 MED ORDER — MIDAZOLAM HCL 2 MG/2ML IJ SOLN
INTRAMUSCULAR | Status: AC
  Filled 2015-07-13: qty 2

## 2015-07-13 MED ORDER — SULFAMETHOXAZOLE-TRIMETHOPRIM 800-160 MG PO TABS: 1.0000 | ORAL_TABLET | Freq: Two times a day (BID) | ORAL | Status: DC

## 2015-07-13 MED ORDER — LACTATED RINGERS IV SOLN
INTRAVENOUS | Status: DC
  Filled 2015-07-13: qty 1000

## 2015-07-13 MED ORDER — OXYCODONE HCL 5 MG PO TABS
5.0000 mg | ORAL_TABLET | Freq: Once | ORAL | Status: AC
  Administered 2015-07-13: 5 mg via ORAL
  Filled 2015-07-13: qty 1

## 2015-07-13 MED ORDER — PROMETHAZINE HCL 25 MG/ML IJ SOLN
6.2500 mg | INTRAMUSCULAR | Status: DC | PRN
  Filled 2015-07-13: qty 1

## 2015-07-13 MED ORDER — SODIUM CHLORIDE 0.9 % IR SOLN
Status: DC | PRN
  Administered 2015-07-13: 3000 mL via INTRAVESICAL

## 2015-07-13 MED ORDER — ONDANSETRON 4 MG PO TBDP: 4.0000 mg | ORAL_TABLET | Freq: Three times a day (TID) | ORAL | Status: DC | PRN

## 2015-07-13 MED ORDER — OXYCODONE HCL 5 MG PO TABS
ORAL_TABLET | ORAL | Status: AC
  Filled 2015-07-13: qty 1

## 2015-07-13 MED ORDER — OXYCODONE-ACETAMINOPHEN 5-325 MG PO TABS: 1.0000 | ORAL_TABLET | Freq: Four times a day (QID) | ORAL | Status: DC | PRN

## 2015-07-13 MED ORDER — TAMSULOSIN HCL 0.4 MG PO CAPS: 0.4000 mg | ORAL_CAPSULE | Freq: Every day | ORAL | Status: DC

## 2015-07-13 MED ORDER — KETOROLAC TROMETHAMINE 30 MG/ML IJ SOLN
INTRAMUSCULAR | Status: DC | PRN
  Administered 2015-07-13: 30 mg via INTRAVENOUS

## 2015-07-13 MED ORDER — MEPERIDINE HCL 25 MG/ML IJ SOLN
6.2500 mg | INTRAMUSCULAR | Status: DC | PRN
  Filled 2015-07-13: qty 1

## 2015-07-13 MED ORDER — PIPERACILLIN-TAZOBACTAM 3.375 G IVPB 30 MIN
3.3750 g | INTRAVENOUS | Status: AC
  Administered 2015-07-13: 3.375 g via INTRAVENOUS
  Filled 2015-07-13 (×2): qty 50

## 2015-07-13 MED ORDER — DEXAMETHASONE SODIUM PHOSPHATE 10 MG/ML IJ SOLN
INTRAMUSCULAR | Status: DC | PRN
  Administered 2015-07-13: 10 mg via INTRAVENOUS

## 2015-07-13 MED ORDER — MIDAZOLAM HCL 5 MG/5ML IJ SOLN
INTRAMUSCULAR | Status: DC | PRN
  Administered 2015-07-13 (×2): 1 mg via INTRAVENOUS

## 2015-07-13 MED ORDER — LIDOCAINE HCL (CARDIAC) 20 MG/ML IV SOLN
INTRAVENOUS | Status: DC | PRN
  Administered 2015-07-13: 80 mg via INTRAVENOUS

## 2015-07-13 MED ORDER — ONDANSETRON HCL 4 MG/2ML IJ SOLN
INTRAMUSCULAR | Status: DC | PRN
Start: 2015-07-13 — End: 2015-07-13
  Administered 2015-07-13: 4 mg via INTRAVENOUS

## 2015-07-13 MED ORDER — FENTANYL CITRATE (PF) 100 MCG/2ML IJ SOLN
INTRAMUSCULAR | Status: DC | PRN
  Administered 2015-07-13 (×8): 25 ug via INTRAVENOUS

## 2015-07-13 MED ORDER — ACETAMINOPHEN 10 MG/ML IV SOLN
INTRAVENOUS | Status: DC | PRN
  Administered 2015-07-13: 1000 mg via INTRAVENOUS

## 2015-07-13 MED ORDER — LACTATED RINGERS IV SOLN
INTRAVENOUS | Status: DC
  Administered 2015-07-13 (×2): via INTRAVENOUS
  Filled 2015-07-13: qty 1000

## 2015-07-13 MED ORDER — FENTANYL CITRATE (PF) 100 MCG/2ML IJ SOLN
25.0000 ug | INTRAMUSCULAR | Status: DC | PRN
  Filled 2015-07-13: qty 1

## 2015-07-13 SURGICAL SUPPLY — 28 items
BAG URO CATCHER STRL LF (DRAPE) ×2 IMPLANT
BASKET LASER NITINOL 1.9FR (BASKET) IMPLANT
BASKET STONE 1.7 NGAGE (UROLOGICAL SUPPLIES) IMPLANT
BASKET ZERO TIP NITINOL 2.4FR (BASKET) IMPLANT
BSKT STON RTRVL 120 1.9FR (BASKET)
BSKT STON RTRVL ZERO TP 2.4FR (BASKET)
CATH INTERMIT  6FR 70CM (CATHETERS) ×2 IMPLANT
CLOTH BEACON ORANGE TIMEOUT ST (SAFETY) ×2 IMPLANT
EXTRACTOR STONE NITINOL NGAGE (UROLOGICAL SUPPLIES) ×2 IMPLANT
FIBER LASER FLEXIVA 365 (UROLOGICAL SUPPLIES) IMPLANT
FIBER LASER TRAC TIP (UROLOGICAL SUPPLIES) IMPLANT
GLOVE BIO SURGEON STRL SZ 6.5 (GLOVE) ×2 IMPLANT
GLOVE BIO SURGEON STRL SZ8 (GLOVE) ×2 IMPLANT
GLOVE INDICATOR 6.5 STRL GRN (GLOVE) ×2 IMPLANT
GOWN STRL REUS W/ TWL LRG LVL3 (GOWN DISPOSABLE) ×1 IMPLANT
GOWN STRL REUS W/ TWL XL LVL3 (GOWN DISPOSABLE) ×1 IMPLANT
GOWN STRL REUS W/TWL LRG LVL3 (GOWN DISPOSABLE) ×2
GOWN STRL REUS W/TWL XL LVL3 (GOWN DISPOSABLE) ×2
GUIDEWIRE ANG ZIPWIRE 038X150 (WIRE) ×2 IMPLANT
GUIDEWIRE STR DUAL SENSOR (WIRE) ×2 IMPLANT
IV NS IRRIG 3000ML ARTHROMATIC (IV SOLUTION) ×4 IMPLANT
MANIFOLD NEPTUNE II (INSTRUMENTS) ×2 IMPLANT
PACK CYSTO (CUSTOM PROCEDURE TRAY) ×2 IMPLANT
SHEATH ACCESS URETERAL 38CM (SHEATH) ×2 IMPLANT
STENT URET 6FRX26 CONTOUR (STENTS) IMPLANT
SYRINGE 10CC LL (SYRINGE) ×2 IMPLANT
TUBE FEEDING 8FR 16IN STR KANG (MISCELLANEOUS) IMPLANT
WATER STERILE IRR 500ML POUR (IV SOLUTION) ×2 IMPLANT

## 2015-07-13 NOTE — Interval H&P Note (Signed)
History and Physical Interval Note:  07/13/2015 9:13 AM  Margaret Phillips  has presented today for surgery, with the diagnosis of left renal stones  The various methods of treatment have been discussed with the patient and family. After consideration of risks, benefits and other options for treatment, the patient has consented to  Procedure(s): CYSTOSCOPY/RETROGRADE/STONE EXTRACTION WITH BASKET AND STENT EXCHANGE/PLACEMENT (Left) as a surgical intervention .  The patient's history has been reviewed, patient examined, no change in status, stable for surgery.  I have reviewed the patient's chart and labs.  Questions were answered to the patient's satisfaction.     MCKENZIE, Margaret L

## 2015-07-13 NOTE — H&P (View-Only) (Signed)
Urology Admission H&P  Chief Complaint: left flank pain  History of Present Illness: Margaret Phillips is a 54yo with a hx of left UPJ stone here for left stone extraction. She underwent left ureteral stenting 2 weeks ago for refractory left flank pain. SHe is currently on coumadin. SHe has moderate LUTS with the stent in place. She denies any fevers/chills/sweats.  Past Medical History  Diagnosis Date  . Factor V Leiden   . History of pulmonary embolism     1987  &  1998  . Renal calculus, left   . History of nephrolithiasis   . Hyperlipidemia   . History of DVT of lower extremity   . PTSD (post-traumatic stress disorder)     NIGHT TERRORS  . Mild obstructive sleep apnea     per pt study done 2006 (approx.)  mild OSA no cpap recommended   Past Surgical History  Procedure Laterality Date  . Cystoscopy with retrograde pyelogram, ureteroscopy and stent placement Left 06/04/2015    Procedure: CYSTOSCOPY WITH RETROGRADE PYELOGRAM,  AND STENT PLACEMENT;  Surgeon: Cleon Gustin, MD;  Location: [facility];  Service: Urology;  Laterality: Left;  . Cervical fusion  1997    C6 - 7  . Cystoscopy/retrograde/ureteroscopy/stone extraction with basket Left 06/18/2015    Procedure: CYSTOSCOPY/RETROGRADE/STONE EXTRACTION WITH BASKET;  Surgeon: Cleon Gustin, MD;  Location: [facility];  Service: Urology;  Laterality: Left;  . Holmium laser application Left 04/17/946    Procedure: HOLMIUM LASER APPLICATION;  Surgeon: Cleon Gustin, MD;  Location[facility];  Service: Urology;  Laterality: Left;  . Cystoscopy w/ ureteral stent placement Left 06/18/2015    Procedure: CYSTOSCOPY WITH STENT REPLACEMENT;  Surgeon: Cleon Gustin, MD;  Location: [facility];  Service: Urology;  Laterality: Left;  . Cystoscopy with ureteroscopy Left 06/18/2015    Procedure: CYSTOSCOPY WITH URETEROSCOPY;  Surgeon: Cleon Gustin, MD;  Location: [facility];   Service: Urology;  Laterality: Left;    Home Medications:  No prescriptions prior to admission   Allergies:  Allergies  Allergen Reactions  . Betadine [Povidone Iodine] Hives  . Contrast Media [Iodinated Diagnostic Agents] Anaphylaxis  . Iodine Anaphylaxis and Hives    History reviewed. No pertinent family history. Social History:  reports that she has never smoked. She has never used smokeless tobacco. She reports that she drinks about 1.8 oz of alcohol per week. She reports that she does not use illicit drugs.  Review of Systems  Genitourinary: Positive for frequency, hematuria and flank pain.  All other systems reviewed and are negative.   Physical Exam:  Vital signs in last 24 hours:   Physical Exam  Constitutional: She is oriented to person, place, and time. She appears well-developed and well-nourished.  HENT:  Head: Normocephalic and atraumatic.  Eyes: EOM are normal. Pupils are equal, round, and reactive to light.  Neck: Normal range of motion. No thyromegaly present.  Cardiovascular: Normal rate and regular rhythm.   Respiratory: Effort normal. No respiratory distress.  GI: Soft. She exhibits no distension.  Musculoskeletal: Normal range of motion.  Neurological: She is alert and oriented to person, place, and time.  Skin: Skin is warm and dry.  Psychiatric: She has a normal mood and affect. Her behavior is normal. Judgment and thought content normal.    Laboratory Data:  No results found for this or any previous visit (from the past 24 hour(s)). No results found for this or any  previous visit (from the past 240 hour(s)). Creatinine: No results for input(s): CREATININE in the last 168 hours.   Impression/Assessment:  Left UPJ stone  Plan:  The risks/benefits/alternatives to left ureteroscopic stone extraction was explained to the patient and she understands and wishes to proceed with surgery.  Osbaldo Mark L 06/23/2015, 10:33 PM

## 2015-07-13 NOTE — Brief Op Note (Signed)
07/13/2015  9:13 AM  PATIENT:  Margaret Phillips  54 y.o. female  PRE-OPERATIVE DIAGNOSIS:  left renal stones  POST-OPERATIVE DIAGNOSIS:  left renal stones  PROCEDURE:  Procedure(s): CYSTOSCOPY/RETROGRADE/STONE EXTRACTION WITH BASKET AND STENT EXCHANGE/PLACEMENT (Left)  SURGEON:  Surgeon(s) and Role:    * Cleon Gustin, MD - Primary  PHYSICIAN ASSISTANT:   ASSISTANTS: none   ANESTHESIA:   general  EBL:  Total I/O In: 200 [I.V.:200] Out: -   BLOOD ADMINISTERED:none  DRAINS: 6x26 JJ stent with tether   LOCAL MEDICATIONS USED:  NONE  SPECIMEN:  Stone for analysis  DISPOSITION OF SPECIMEN:  N/A  COUNTS:  YES  TOURNIQUET:  * No tourniquets in log *  DICTATION: .Note written in EPIC  PLAN OF CARE: Discharge to home after PACU  PATIENT DISPOSITION:  PACU - hemodynamically stable.   Delay start of Pharmacological VTE agent (>24hrs) due to surgical blood loss or risk of bleeding: not applicable

## 2015-07-13 NOTE — Op Note (Signed)
Preoperative diagnosis: Left renal stone  Postoperative diagnosis: Same  Procedure: 1 cystoscopy 2. Left retrograde pyelography 3.  Intraoperative fluoroscopy, under one hour, with interpretation 4.  Left ureteroscopic stone manipulation with basket extraction 5.  Left 6 x 26 JJ stent exchange  Attending: Nicolette Bang  Anesthesia: General  Estimated blood loss: None  Drains: Left 6 x 26 JJ ureteral stent with tether  Specimens: stone  Antibiotics: Zosyn  Findings: left renal stones, multiple ureteral stones. No hydronephrosis. No masses/lesions in the bladder. Ureteral orifices in normal anatomic location.  Indications: Patient is a 54 year old female with a history of left renal stone and who has persistent left flank pain.  After discussing treatment options, she decided proceed with left ureteroscopic stone manipulation.  Procedure her in detail: The patient was brought to the operating room and a brief timeout was done to ensure correct patient, correct procedure, correct site.  General anesthesia was administered patient was placed in dorsal lithotomy position. Their genitalia was then prepped and draped in usual sterile fashion.  A rigid 48 French cystoscope was passed in the urethra and the bladder.  Bladder was inspected free masses or lesions.  the ureteral orifices were in the normal orthotopic locations. Using a grasper the stent was brought to the urethral meatus. A zip wire was advanced through the stent and up to the renal pelvis. The stent was then removed.   a 6 french ureteral catheter was then instilled into the left ureteral orifice.  a gentle retrograde was obtained and findings noted above.    we then removed the cystoscope and cannulated the left ureteral orifice with a semirigid ureteroscope.  Multiple stones were encountered in the ureter and the stones were removed with a NGage basket. Once we reached the UPJ a sensor wire was advanced in to the renal pelvis. We  then advanced a 12/14x38cm access sheath up to the renal pelvis. We used a flexible ureteroscope to perform nephroscopy. We encountered the multiple stones in the renal pelvis and lower pole. the pieces were then removed with a Ngage basket. Once the pieces were removed we then removed the access sheath under direct vision. No injury to the ureter was noted.  we then placed a 6 x 26 double-j ureteral stent over the original zip wire. We then removed the wire and good coil was noted in the the renal pelvis under fluoroscopy and the bladder under direct vision.   the bladder was then drained and this concluded the procedure which was well tolerated by patient.  Complications: None  Condition: Stable, extubated, transferred to PACU  Plan: Patient is to be discharged home as to follow-up in one week. She is to remove her stent in 48 hours.

## 2015-07-13 NOTE — Anesthesia Procedure Notes (Signed)
Procedure Name: LMA Insertion Date/Time: 07/13/2015 8:23 AM Performed by: Justice Rocher Pre-anesthesia Checklist: Patient identified, Emergency Drugs available, Suction available and Patient being monitored Patient Re-evaluated:Patient Re-evaluated prior to inductionOxygen Delivery Method: Circle System Utilized Preoxygenation: Pre-oxygenation with 100% oxygen Intubation Type: IV induction Ventilation: Mask ventilation without difficulty LMA: LMA inserted LMA Size: 5.0 Number of attempts: 1 Airway Equipment and Method: Bite block Placement Confirmation: positive ETCO2 Tube secured with: Tape Dental Injury: Teeth and Oropharynx as per pre-operative assessment  Comments: Grey wedge for shoulder support for optinum postioning for induction - smooth no problems

## 2015-07-13 NOTE — Anesthesia Postprocedure Evaluation (Signed)
  Anesthesia Post-op Note  Patient: Margaret Phillips  Procedure(s) Performed: Procedure(s) (LRB): CYSTOSCOPY/RETROGRADE/STONE EXTRACTION WITH BASKET AND STENT EXCHANGE/PLACEMENT (Left)  Patient Location: PACU  Anesthesia Type: General  Level of Consciousness: awake and alert   Airway and Oxygen Therapy: Patient Spontanous Breathing  Post-op Pain: mild  Post-op Assessment: Post-op Vital signs reviewed, Patient's Cardiovascular Status Stable, Respiratory Function Stable, Patent Airway and No signs of Nausea or vomiting  Last Vitals:  Filed Vitals:   07/13/15 0945  BP: 145/80  Pulse: 71  Temp:   Resp: 14    Post-op Vital Signs: stable   Complications: No apparent anesthesia complications

## 2015-07-13 NOTE — Discharge Instructions (Signed)
[facility] [phone] Post Ureteroscopy With or Without Stent Instructions  Definitions:  Ureter: The duct that transports urine from the kidney to the bladder. Stent:   A plastic hollow tube that is placed into the ureter, from the kidney to the                 bladder to prevent the ureter from swelling shut.  GENERAL INSTRUCTIONS:  Despite the fact that no skin incisions were used, the area around the ureter and bladder is raw and irritated. The stent is a foreign body which will further irritate the bladder wall. This irritation is manifested by increased frequency of urination, both day and night, and by an increase in the urge to urinate. In some, the urge to urinate is present almost always. Sometimes the urge is strong enough that you may not be able to stop yourself from urinating. The only real cure is to remove the stent and then give time for the bladder wall to heal which can't be done until the danger of the ureter swelling shut has passed, which varies.  You may see some blood in your urine while the stent is in place and a few days afterwards. Do not be alarmed, even if the urine was clear for a while. Get off your feet and drink lots of fluids until clearing occurs. If you start to pass clots or don't improve, call us.  DIET: You may return to your normal diet immediately. Because of the raw surface of your bladder, alcohol, spicy foods, acid type foods and drinks with caffeine may cause irritation or frequency and should be used in moderation. To keep your urine flowing freely and to avoid constipation, drink plenty of fluids during the day ( 8-10 glasses ). Tip: Avoid cranberry juice because it is very acidic.  ACTIVITY: Your physical activity doesn't need to be restricted. However, if you are very active, you may see some blood in your urine. We suggest that you reduce your activity under these circumstances until the bleeding has stopped.  BOWELS: It is  important to keep your bowels regular during the postoperative period. Straining with bowel movements can cause bleeding. A bowel movement every other day is reasonable. Use a mild laxative if needed, such as Milk of Magnesia 2-3 tablespoons, or 2 Dulcolax tablets. Call if you continue to have problems. If you have been taking narcotics for pain, before, during or after your surgery, you may be constipated. Take a laxative if necessary.   MEDICATION: You should resume your pre-surgery medications unless told not to. In addition you will often be given an antibiotic to prevent infection. These should be taken as prescribed until the bottles are finished unless you are having an unusual reaction to one of the drugs.  PROBLEMS YOU SHOULD REPORT TO [place]:  Fevers over 100.5 Fahrenheit.  Heavy bleeding, or clots ( See above notes about blood in urine ).  Inability to urinate.  Drug reactions ( hives, rash, nausea, vomiting, diarrhea ).  Severe burning or pain with urination that is not improving.  FOLLOW-UP: You will need a follow-up appointment to monitor your progress. Call for this appointment at the number listed above. Usually the first appointment will be about three to fourteen days after your surgery.  **Please remove stent in 48 hours by pulling string   Post Anesthesia Home Care Instructions  Activity: Get plenty of rest for the remainder of the day. A responsible adult should stay with you  for 24 hours following the procedure.  For the next 24 hours, DO NOT: -Drive a car -Paediatric nurse -Drink alcoholic beverages -Take any medication unless instructed by your physician -Make any legal decisions or sign important papers.  Meals: Start with liquid foods such as gelatin or soup. Progress to regular foods as tolerated. Avoid greasy, spicy, heavy foods. If nausea and/or vomiting occur, drink only clear liquids until the nausea and/or vomiting subsides. Call your physician if  vomiting continues.  Special Instructions/Symptoms: Your throat may feel dry or sore from the anesthesia or the breathing tube placed in your throat during surgery. If this causes discomfort, gargle with warm salt water. The discomfort should disappear within 24 hours.  If you had a scopolamine patch placed behind your ear for the management of post- operative nausea and/or vomiting:  1. The medication in the patch is effective for 72 hours, after which it should be removed.  Wrap patch in a tissue and discard in the trash. Wash hands thoroughly with soap and water. 2. You may remove the patch earlier than 72 hours if you experience unpleasant side effects which may include dry mouth, dizziness or visual disturbances. 3. Avoid touching the patch. Wash your hands with soap and water after contact with the patch.

## 2015-07-13 NOTE — Anesthesia Preprocedure Evaluation (Signed)
Anesthesia Evaluation  Patient identified by MRN, date of birth, ID band Patient awake    Reviewed: Allergy & Precautions, NPO status , Patient's Chart, lab work & pertinent test results  Airway Mallampati: II  TM Distance: >3 FB Neck ROM: Full    Dental no notable dental hx.    Pulmonary sleep apnea , PE   Pulmonary exam normal breath sounds clear to auscultation       Cardiovascular negative cardio ROS Normal cardiovascular exam Rhythm:Regular Rate:Normal     Neuro/Psych negative neurological ROS  negative psych ROS   GI/Hepatic negative GI ROS, Neg liver ROS,   Endo/Other  Morbid obesity  Renal/GU negative Renal ROS  negative genitourinary   Musculoskeletal negative musculoskeletal ROS (+)   Abdominal   Peds negative pediatric ROS (+)  Hematology negative hematology ROS (+)   Anesthesia Other Findings   Reproductive/Obstetrics negative OB ROS                             Anesthesia Physical  Anesthesia Plan  ASA: III  Anesthesia Plan: General   Post-op Pain Management:    Induction: Intravenous  Airway Management Planned: LMA  Additional Equipment:   Intra-op Plan:   Post-operative Plan: Extubation in OR  Informed Consent: I have reviewed the patients History and Physical, chart, labs and discussed the procedure including the risks, benefits and alternatives for the proposed anesthesia with the patient or authorized representative who has indicated his/her understanding and acceptance.   Dental advisory given  Plan Discussed with: CRNA and Surgeon  Anesthesia Plan Comments:         Anesthesia Quick Evaluation

## 2015-07-13 NOTE — Transfer of Care (Signed)
Immediate Anesthesia Transfer of Care Note  Patient: Margaret Phillips  Procedure(s) Performed: Procedure(s): CYSTOSCOPY/RETROGRADE/STONE EXTRACTION WITH BASKET AND STENT EXCHANGE/PLACEMENT (Left)  Patient Location: PACU  Anesthesia Type:General  Level of Consciousness: awake, alert , oriented and patient cooperative  Airway & Oxygen Therapy: Patient Spontanous Breathing and Patient connected to face mask oxygen  Post-op Assessment: Report given to RN and Post -op Vital signs reviewed and stable  Post vital signs: Reviewed and stable  Last Vitals:  Filed Vitals:   07/13/15 0643  BP: 150/85  Pulse: 78  Temp: 36.5 C  Resp: 16    Complications: No apparent anesthesia complications

## 2015-07-14 ENCOUNTER — Encounter (HOSPITAL_BASED_OUTPATIENT_CLINIC_OR_DEPARTMENT_OTHER): Payer: Self-pay | Admitting: Urology

## 2016-06-16 ENCOUNTER — Other Ambulatory Visit: Payer: Self-pay | Admitting: Orthopaedic Surgery

## 2016-06-16 DIAGNOSIS — M25562 Pain in left knee: Secondary | ICD-10-CM

## 2016-06-17 HISTORY — PX: KNEE SURGERY: SHX244

## 2016-06-21 ENCOUNTER — Ambulatory Visit
Admission: RE | Admit: 2016-06-21 | Discharge: 2016-06-21 | Disposition: A | Discharge status: Home / Self Care | Payer: BLUE CROSS/BLUE SHIELD | Source: Ambulatory Visit | Attending: Orthopaedic Surgery | Admitting: Orthopaedic Surgery

## 2016-06-21 DIAGNOSIS — M25562 Pain in left knee: Secondary | ICD-10-CM

## 2016-06-22 ENCOUNTER — Ambulatory Visit
Admission: RE | Admit: 2016-06-22 | Discharge: 2016-06-22 | Disposition: A | Discharge status: Home / Self Care | Payer: BLUE CROSS/BLUE SHIELD | Source: Ambulatory Visit | Attending: Orthopaedic Surgery | Admitting: Orthopaedic Surgery

## 2016-06-22 ENCOUNTER — Other Ambulatory Visit: Payer: Self-pay | Admitting: Orthopaedic Surgery

## 2016-06-22 ENCOUNTER — Telehealth: Payer: Self-pay | Admitting: Hematology

## 2016-06-22 ENCOUNTER — Other Ambulatory Visit: Payer: BLUE CROSS/BLUE SHIELD

## 2016-06-22 DIAGNOSIS — M79662 Pain in left lower leg: Secondary | ICD-10-CM

## 2016-06-22 DIAGNOSIS — R52 Pain, unspecified: Secondary | ICD-10-CM

## 2016-06-22 DIAGNOSIS — R609 Edema, unspecified: Secondary | ICD-10-CM

## 2016-06-22 NOTE — Telephone Encounter (Signed)
Biagio Borg, PA from [employer] cld needing the pt to be followed by a hematologist for Factor V Leiden and pt needing to be seen urgently bc they want to perform surgery on the pt within the next week. Cld Lashonya to see if it were ok to put pt on Dr. Grier Mitts schedule for 9/7 at 1230pm. Cld Mr. Petrarca back to give pt appt date and time. Pt agreed. Record of pt's condition is in EPIC.

## 2016-06-23 ENCOUNTER — Ambulatory Visit (HOSPITAL_BASED_OUTPATIENT_CLINIC_OR_DEPARTMENT_OTHER): Payer: BLUE CROSS/BLUE SHIELD | Admitting: Hematology

## 2016-06-23 ENCOUNTER — Encounter: Payer: Self-pay | Admitting: Hematology

## 2016-06-23 ENCOUNTER — Telehealth: Payer: Self-pay | Admitting: Hematology

## 2016-06-23 ENCOUNTER — Ambulatory Visit (HOSPITAL_BASED_OUTPATIENT_CLINIC_OR_DEPARTMENT_OTHER): Payer: BLUE CROSS/BLUE SHIELD

## 2016-06-23 VITALS — BP 139/91 | HR 76 | Temp 98.4°F | Resp 18 | Ht 66.0 in | Wt 290.1 lb

## 2016-06-23 DIAGNOSIS — G4730 Sleep apnea, unspecified: Secondary | ICD-10-CM

## 2016-06-23 DIAGNOSIS — I87009 Postthrombotic syndrome without complications of unspecified extremity: Secondary | ICD-10-CM | POA: Diagnosis not present

## 2016-06-23 DIAGNOSIS — Z86718 Personal history of other venous thrombosis and embolism: Secondary | ICD-10-CM

## 2016-06-23 DIAGNOSIS — S83207A Unspecified tear of unspecified meniscus, current injury, left knee, initial encounter: Secondary | ICD-10-CM

## 2016-06-23 DIAGNOSIS — Z86711 Personal history of pulmonary embolism: Secondary | ICD-10-CM

## 2016-06-23 DIAGNOSIS — Z6841 Body Mass Index (BMI) 40.0 and over, adult: Secondary | ICD-10-CM

## 2016-06-23 DIAGNOSIS — M25562 Pain in left knee: Secondary | ICD-10-CM

## 2016-06-23 DIAGNOSIS — D6851 Activated protein C resistance: Secondary | ICD-10-CM

## 2016-06-23 DIAGNOSIS — Z01818 Encounter for other preprocedural examination: Secondary | ICD-10-CM

## 2016-06-23 LAB — CBC & DIFF AND RETIC
BASO%: 0.4 % (ref 0.0–2.0)
Basophils Absolute: 0 10*3/uL (ref 0.0–0.1)
EOS ABS: 0 10*3/uL (ref 0.0–0.5)
EOS%: 0.8 % (ref 0.0–7.0)
HCT: 42 % (ref 34.8–46.6)
HGB: 13.9 g/dL (ref 11.6–15.9)
IMMATURE RETIC FRACT: 3.6 % (ref 1.60–10.00)
LYMPH%: 28 % (ref 14.0–49.7)
MCH: 29.9 pg (ref 25.1–34.0)
MCHC: 33.1 g/dL (ref 31.5–36.0)
MCV: 90.3 fL (ref 79.5–101.0)
MONO#: 0.2 10*3/uL (ref 0.1–0.9)
MONO%: 4.4 % (ref 0.0–14.0)
NEUT%: 66.4 % (ref 38.4–76.8)
NEUTROS ABS: 3.1 10*3/uL (ref 1.5–6.5)
PLATELETS: 177 10*3/uL (ref 145–400)
RBC: 4.65 10*6/uL (ref 3.70–5.45)
RDW: 13.8 % (ref 11.2–14.5)
RETIC CT ABS: 44.64 10*3/uL (ref 33.70–90.70)
Retic %: 0.96 % (ref 0.70–2.10)
WBC: 4.7 10*3/uL (ref 3.9–10.3)
lymph#: 1.3 10*3/uL (ref 0.9–3.3)
nRBC: 0 % (ref 0–0)

## 2016-06-23 LAB — COMPREHENSIVE METABOLIC PANEL
ALT: 24 U/L (ref 0–55)
AST: 23 U/L (ref 5–34)
Albumin: 4.2 g/dL (ref 3.5–5.0)
Alkaline Phosphatase: 50 U/L (ref 40–150)
Anion Gap: 11 mEq/L (ref 3–11)
BILIRUBIN TOTAL: 0.72 mg/dL (ref 0.20–1.20)
BUN: 10 mg/dL (ref 7.0–26.0)
CO2: 24 meq/L (ref 22–29)
CREATININE: 0.7 mg/dL (ref 0.6–1.1)
Calcium: 10.1 mg/dL (ref 8.4–10.4)
Chloride: 105 mEq/L (ref 98–109)
EGFR: >90 mL/min/{1.73_m2} (ref >90)
GLUCOSE: 94 mg/dL (ref 70–140)
Potassium: 4.2 mEq/L (ref 3.5–5.1)
SODIUM: 139 meq/L (ref 136–145)
TOTAL PROTEIN: 7.7 g/dL (ref 6.4–8.3)

## 2016-06-23 LAB — PROTIME-INR
INR: 2.2 (ref 2.00–3.50)
Protime: 26.4 Seconds — ABNORMAL HIGH (ref 10.6–13.4)

## 2016-06-23 MED ORDER — ENOXAPARIN SODIUM 60 MG/0.6ML ~~LOC~~ SOLN: 60.0000 mg | Freq: Two times a day (BID) | SUBCUTANEOUS | 0 refills | Status: DC

## 2016-06-23 NOTE — Progress Notes (Signed)
Marland Kitchen    HEMATOLOGY/ONCOLOGY CONSULTATION NOTE  Date of Service: 06/23/2016  Patient Care Team: Provider Not In System as PCP - General  CHIEF COMPLAINTS/PURPOSE OF CONSULTATION:  Factor V leiden mutation  HISTORY OF PRESENTING ILLNESS:   Margaret Phillips is a wonderful 55 y.o. 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  Patient reports that she had a right lower extremity DVT following a bunionectomy with the cast on her right lower extremity and while being on oral contraceptives in 1989 which was characterized by right lower extremity pain. It was not picked up immediately and a couple of months later she presented with a syncopal episode and reports having had a cardiac arrest and was finally noted to have significant bilateral pulmonary embolism. Patient reports that she was on IV heparin followed by Coumadin which was continued for 1 year. Her oral contraceptives were discontinued. She had been on them for about 7 years prior to her DVT/PE.  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 She was restarted on Coumadin and has been on Coumadin ever since then.  She notes that she was a part of the study at [facility] in the late 90s when she was diagnosed with having factor V Leiden mutation. She is unclear if this was heterozygous or homozygous mutation though the former is much more likely.  Patient reports that she has had no issues with tolerating the Coumadin and no significant issues with bleeding. She was followed by Dr. Larey Days hematology in [facility].   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 She reports that she recently misstepped and her left knee pain without much worse and that she hasn't been able to ambulate much and has to use a walker. Patient was seen by orthopedics and had an MRI of her knee on 06/21/2016 which showed a complex tear involving the posterior horn and mid body junction region of the medial meniscus.  She was recommended arthroscopic surgery and is here for perioperative management of her factor V Leiden and anticoagulation.  Patient also reported some left calf pain and had an ultrasound of the left lower extremity which showed no evidence of DVT.  Patient notes that she's had previous discussion with her other physicians and have decided to continue on warfarin for chronic anticoagulation instead of the other NOAC's. She notes that she is currently taking 15 mg of warfarin on Monday Wednesday and Friday and 10 mg the other days and that her INR has been fairly stable and is currently being monitored every 4 weeks. She has previously taken Lovenox shots for bridging anticoagulation and is able to do this without any problems.  Patient has never been a smoker.. Notes that her dad had DVTs and multiple aneurysms.  MEDICAL HISTORY:  Past Medical History:  Diagnosis Date  . Factor V Leiden   . History of DVT of lower extremity   . History of nephrolithiasis   . History of pulmonary embolism    1987  &  1998  . Hyperlipidemia   . Mild  obstructive sleep apnea    per pt study done 2006 (approx.)  mild OSA no cpap recommended  . PTSD (post-traumatic stress disorder)    NIGHT TERRORS  . Renal calculus, left   Morbid obesity .Body mass index is 46.82 kg/m.  History of factor V Leiden mutation History of recurrent DVT and PE as detailed above. Both the episodes appear to have been triggered , although the underlying factor V Leiden mutation was a risk factor in  addition.  Restless leg syndrome Cervical spinal fusion History of recurrent urinary stones Urgent incontinence  SURGICAL HISTORY: Past Surgical History:  Procedure Laterality Date  . CERVICAL FUSION  1997   C6 - 7  . CYSTOSCOPY W/ URETERAL STENT PLACEMENT Left 06/18/2015   Procedure: CYSTOSCOPY WITH STENT REPLACEMENT;  Surgeon: Cleon Gustin, MD;  [facility];  Service: Urology;  Laterality: Left;  . CYSTOSCOPY WITH RETROGRADE PYELOGRAM, URETEROSCOPY AND STENT PLACEMENT Left 06/04/2015   Procedure: CYSTOSCOPY WITH RETROGRADE PYELOGRAM,  AND STENT PLACEMENT;  Surgeon: Cleon Gustin, MD;  Location: [facility];  Service: Urology;  Laterality: Left;  . CYSTOSCOPY WITH RETROGRADE PYELOGRAM, URETEROSCOPY AND STENT PLACEMENT Left 07/13/2015   Procedure: CYSTOSCOPY/RETROGRADE/STONE EXTRACTION WITH BASKET AND STENT EXCHANGE/PLACEMENT;  Surgeon: Cleon Gustin, MD;  Location[facility];  Service: Urology;  Laterality: Left;  . CYSTOSCOPY WITH URETEROSCOPY Left 06/18/2015   Procedure: CYSTOSCOPY WITH URETEROSCOPY;  Surgeon: Cleon Gustin, MD;  Location: [facility];  Service: Urology;  Laterality: Left;  . CYSTOSCOPY/RETROGRADE/URETEROSCOPY/STONE EXTRACTION WITH BASKET Left 06/18/2015   Procedure: CYSTOSCOPY/RETROGRADE/STONE EXTRACTION WITH BASKET;  Surgeon: Cleon Gustin, MD;  Location: [facility];  Service: Urology;  Laterality: Left;  . HOLMIUM LASER APPLICATION Left [id number]   Procedure: HOLMIUM LASER APPLICATION;  Surgeon: Cleon Gustin, MD;  Location: [facility];  Service: Urology;  Laterality: Left;    SOCIAL HISTORY: Social History   Social History  . Marital status: Married    Spouse name: N/A  . Number of children: N/A  . Years of education: N/A   Occupational History  . Not on file.   Social History Main Topics  . Smoking status: Never Smoker  . Smokeless tobacco: Never  Used  . Alcohol use 1.8 oz/week    3 Glasses of wine per week     Comment: weekends only  . Drug use: No  . Sexual activity: Not on file   Other Topics Concern  . Not on file   Social History Narrative  . No narrative on file    FAMILY HISTORY: No family history on file.  ALLERGIES:  is allergic to betadine [povidone iodine]; contrast media [iodinated diagnostic agents]; iodine; and latex.  MEDICATIONS:  Current Outpatient Prescriptions  Medication Sig Dispense Refill  . acetaminophen (TYLENOL) 500 MG tablet Take 1,000 mg by mouth every 6 (six) hours as needed for fever.    Marland Kitchen atorvastatin (LIPITOR) 10 MG tablet Take 10 mg by mouth at bedtime.  4  . clonazePAM (KLONOPIN) 0.5 MG tablet Take 0.5 mg by mouth 2 (two) times daily as needed for anxiety.    . Meth-Hyo-M Bl-Na Phos-Ph Sal (URIBEL) 118 MG CAPS Take 1 capsule by mouth daily as needed (discomfort).    . ondansetron (ZOFRAN ODT) 4 MG disintegrating tablet Take 1 tablet (4 mg total) by mouth every 8 (eight) hours as needed for nausea or vomiting. 30 tablet 0  . oxyCODONE-acetaminophen (ROXICET) 5-325 MG per tablet  Take 1 tablet by mouth every 6 (six) hours as needed for severe pain. 30 tablet 0  . prazosin (MINIPRESS) 2 MG capsule Take 2 mg by mouth at bedtime.   7  . promethazine (PHENERGAN) 25 MG tablet Take 25 mg by mouth every 6 (six) hours as needed for nausea or vomiting.    . sertraline (ZOLOFT) 100 MG tablet Take 100 mg by mouth at bedtime.    . solifenacin (VESICARE) 5 MG tablet Take 5 mg by mouth every morning.    . sulfamethoxazole-trimethoprim (BACTRIM DS,SEPTRA DS) 800-160 MG per tablet Take 1 tablet by mouth 2 (two) times daily. 4 tablet 0  . tamsulosin (FLOMAX) 0.4 MG CAPS capsule Take 1 capsule (0.4 mg total) by mouth daily. 30 capsule 0  . warfarin (COUMADIN) 5 MG tablet Take 10 mg by mouth daily.      No current facility-administered medications for this visit.     REVIEW OF SYSTEMS:    10 Point review of  Systems was done is negative except as noted above.  PHYSICAL EXAMINATION: ECOG PERFORMANCE STATUS: 2 - Symptomatic, <50% confined to bed  . Vitals:   06/23/16 1252  BP: (!) 139/91  Pulse: 76  Resp: 18  Temp: 98.4 F (36.9 C)   Filed Weights   06/23/16 1252  Weight: 290 lb 1.6 oz (131.6 kg)   .Body mass index is 46.82 kg/m.  GENERAL:alert, in no acute distress and comfortable SKIN: skin color, texture, turgor are normal, no rashes or significant lesions EYES: normal, conjunctiva are pink and non-injected, sclera clear OROPHARYNX:no exudate, no erythema and lips, buccal mucosa, and tongue normal  NECK: supple, no JVD, thyroid normal size, non-tender, without nodularity LYMPH:  no palpable lymphadenopathy in the cervical, axillary or inguinal LUNGS: clear to auscultation with normal respiratory effort HEART: regular rate & rhythm,  no murmurs and no lower extremity edema ABDOMEN: abdomen soft, non-tender, normoactive bowel sounds  Musculoskeletal: no cyanosis of digits and no clubbing  PSYCH: alert & oriented x 3 with fluent speech NEURO: no focal motor/sensory deficits  LABORATORY DATA:  I have reviewed the data as listed  . CBC Latest Ref Rng & Units 06/25/2015  WBC 4.0 - 10.5 K/uL 7.8  Hemoglobin 12.0 - 15.0 g/dL 11.5(L)  Hematocrit 36.0 - 46.0 % 35.4(L)  Platelets 150 - 400 K/uL 211    . CMP Latest Ref Rng & Units 06/25/2015  Glucose 65 - 99 mg/dL 114(H)  BUN 6 - 20 mg/dL 19  Creatinine 0.44 - 1.00 mg/dL 1.00  Sodium 135 - 145 mmol/L 136  Potassium 3.5 - 5.1 mmol/L 4.5  Chloride 101 - 111 mmol/L 105  CO2 22 - 32 mmol/L 23  Calcium 8.9 - 10.3 mg/dL 9.0  Total Protein 6.5 - 8.1 g/dL 7.2  Total Bilirubin 0.3 - 1.2 mg/dL 0.5  Alkaline Phos 38 - 126 U/L 53  AST 15 - 41 U/L 24  ALT 14 - 54 U/L 19    .Body mass index is 46.82 kg/m.  RADIOGRAPHIC STUDIES: I have personally reviewed the radiological images as listed and agreed with the findings in the  report. Mr Knee Left  Wo Contrast  Result Date: 06/21/2016 CLINICAL DATA:  The left knee pain and swelling since April 2017. Sharp medial knee pain. EXAM: MRI OF THE LEFT KNEE WITHOUT CONTRAST TECHNIQUE: Multiplanar, multisequence MR imaging of the knee was performed. No intravenous contrast was administered. COMPARISON:  None. FINDINGS: MENISCI Medial meniscus: There is a complex tear involving  the posterior horn mid body junction region. This is largely a radial tear but extends to a free edge and undersurface tear more anteriorly. Lateral meniscus:  Intact LIGAMENTS Cruciates:  Intact Collaterals:  Intact.  Mild MCL and pes anserine bursitis. CARTILAGE Patellofemoral: Mild degenerative chondrosis/ chondromalacia mainly involving the lateral facet. Medial: Moderate degenerative chondrosis/ chondromalacia with early joint space narrowing and spurring. Lateral: Mild to moderate degenerative chondrosis with minimal spurring change. Joint:  Small joint effusion and mild synovitis. Popliteal Fossa:  No popliteal mass or Baker's cyst. Extensor Mechanism: The patella retinacular structures are intact and the quadriceps and patellar tendons are intact. There is mild lateral tilt and orientation of the patella likely due to body habitus and Q angle. Bones:  No acute bony findings. Other: None IMPRESSION: 1. Complex tear involving the posterior horn mid body junction region of the medial meniscus as described above. 2. Intact ligamentous structures and no acute bony findings. 3. Mild tricompartmental degenerative changes. 4. Chondromalacia patella laterally and mild lateral tilt and orientation of the patella in relation to the femoral trochlear groove. 5. Small joint effusion and mild synovitis. Electronically Signed   By: Marijo Sanes M.D.   On: 06/21/2016 08:28   US Venous Img Lower Unilateral Left  Result Date: 06/22/2016 CLINICAL DATA:  55 year old female with left calf pain and swelling EXAM: LEFT LOWER  EXTREMITY VENOUS DOPPLER ULTRASOUND TECHNIQUE: Gray-scale sonography with graded compression, as well as color Doppler and duplex ultrasound were performed to evaluate the lower extremity deep venous systems from the level of the common femoral vein and including the common femoral, femoral, profunda femoral, popliteal and calf veins including the posterior tibial, peroneal and gastrocnemius veins when visible. The superficial great saphenous vein was also interrogated. Spectral Doppler was utilized to evaluate flow at rest and with distal augmentation maneuvers in the common femoral, femoral and popliteal veins. COMPARISON:  None. FINDINGS: Contralateral Common Femoral Vein: Respiratory phasicity is normal and symmetric with the symptomatic side. No evidence of thrombus. Normal compressibility. Common Femoral Vein: No evidence of thrombus. Normal compressibility, respiratory phasicity and response to augmentation. Saphenofemoral Junction: No evidence of thrombus. Normal compressibility and flow on color Doppler imaging. Profunda Femoral Vein: No evidence of thrombus. Normal compressibility and flow on color Doppler imaging. Femoral Vein: No evidence of thrombus. Normal compressibility, respiratory phasicity and response to augmentation. Popliteal Vein: No evidence of thrombus. Normal compressibility, respiratory phasicity and response to augmentation. Calf Veins: No evidence of thrombus. Normal compressibility and flow on color Doppler imaging. Superficial Great Saphenous Vein: No evidence of thrombus. Normal compressibility and flow on color Doppler imaging. Venous Reflux:  None. Other Findings:  None. IMPRESSION: No evidence of deep venous thrombosis. Electronically Signed   By: Jacqulynn Cadet M.D.   On: 06/22/2016 15:33    ASSESSMENT & PLAN:   Patient is a 55 year old Caucasian female with history of recurrent DVT/PE and known factor V Leiden mutation here for preoperative evaluation prior to planned  knee surgery.  1) Left knee acute on chronic pain with significant limitation of range of motion due to complex medial meniscal tear which apparently needs arthroscopic surgery. Patient is keen to have the surgery since the discomfort and mobility limitations are significantly affecting her quality of life.  2) history of reported factor V Leiden mutation - likely heterozygous though results are not available to [place] and was done in the 1990s at [facility]. We shall repeat the test today to confirm this. 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 The events were triggered initially by immobility in a cast and  oral contraceptives and the second time by long distance driving. Her pulmonary embolism however cause what sounds like cardiac arrest possibly PEA and she has been recommended lifelong anticoagulation. Her morbid obesity is certainly an additional acquired risk factor. Patient is a nonsmoker.  3) RLE post-thrombotic syndrome.  4) morbid obesity  .Body mass index is 46.82 kg/m.  5) Sleep apnea - likely need a repeat assessment with her primary care physician . PLAN -To reduce the patient's risk of another triggered venous thromboembolic event we shall use prophylactic dose of Lovenox perioperatively. -We'll recheck factor V Leiden mutation labs to have for our records. -The patient's INR is therapeutic today and below 3.5 she would need to hold her warfarin for 5 days prior to surgery and start on Lovenox 60 mg every 12 hours (high prophylactic dose given the patients weight) preoperatively while off Coumadin . She will hold her Lovenox dose in the evening prior to the surgery . -Would recommend early ambulation after surgery if possible . -Would recommend intraoperative SCD/foot pumps if possible. -Restart Lovenox 12-24 hours after surgery if no significant concern with excessive bleeding  and okay as per surgeon. -Restart Coumadin in 24 hours after surgery if no issues with significant hemorrhage. -Continue prophylactic Lovenox for 5 days postoperatively to allow for Coumadin to become effective  -Recheck INR with primary care physician in 5-6 days postoperatively . -Patient okay to proceed for her planned surgery from a hematologic standpoint with a bowel instructions . -She has been given a prescription for the Lovenox . -All of the patient's questions and concerns were addressed and she is comfortable with this plan of care .  Continue follow-up with primary care physician We shall see her back in 3-4 weeks postoperatively for a folowup.  All of the patients questions were answered with apparent satisfaction. The patient knows to call the clinic with any problems, questions or concerns.  I spent 60 minutes counseling the patient face to face. The total time spent in the appointment was 60 minutes and more than 50% was on counseling and direct patient cares.    Sullivan Lone MD [place] AAHIVMS [facility] Hematology/Oncology Physician [facility]  (Office):       [phone] (Work cell):  [phone] (Fax):           [phone]  06/23/2016 12:47 PM

## 2016-06-23 NOTE — Patient Instructions (Signed)
-  Hold Coumadin 5 days prior to planned surgery and start on bridging prophylactic  lovenox injections 60mg  subcutaneously every 12hours. Hold evening dose prior to surgery. -restart lovenox 12-24 hours after surgery if no bleeding issues from surgery. May restart coumadin the day after surgery if no bleeding. Continue lovenox for 5 days post-operatively for bridging till the coumadin  becomes effective. -consider early ambulation and compression socks use to further reduce risk of DVT from surgical procedure. -continue coumadin monitoring post-operatively with PCP -we shall see you in 1 month post-operative to make sure you are doing okay

## 2016-06-23 NOTE — Telephone Encounter (Signed)
Avs report and  appt schd given per  06/23/16 los °

## 2016-06-28 LAB — FACTOR 5 LEIDEN

## 2016-07-25 ENCOUNTER — Other Ambulatory Visit: Payer: Self-pay | Admitting: *Deleted

## 2016-07-25 DIAGNOSIS — D6851 Activated protein C resistance: Secondary | ICD-10-CM

## 2016-07-26 ENCOUNTER — Ambulatory Visit (INDEPENDENT_AMBULATORY_CARE_PROVIDER_SITE_OTHER): Payer: BLUE CROSS/BLUE SHIELD | Admitting: Orthopaedic Surgery

## 2016-07-26 ENCOUNTER — Ambulatory Visit (HOSPITAL_BASED_OUTPATIENT_CLINIC_OR_DEPARTMENT_OTHER): Payer: BLUE CROSS/BLUE SHIELD | Admitting: Hematology

## 2016-07-26 ENCOUNTER — Encounter: Payer: Self-pay | Admitting: Hematology

## 2016-07-26 ENCOUNTER — Other Ambulatory Visit (HOSPITAL_BASED_OUTPATIENT_CLINIC_OR_DEPARTMENT_OTHER): Payer: BLUE CROSS/BLUE SHIELD

## 2016-07-26 VITALS — BP 120/52 | HR 76 | Temp 98.2°F | Resp 20 | Wt 298.4 lb

## 2016-07-26 DIAGNOSIS — M25562 Pain in left knee: Secondary | ICD-10-CM | POA: Diagnosis not present

## 2016-07-26 DIAGNOSIS — G4730 Sleep apnea, unspecified: Secondary | ICD-10-CM | POA: Diagnosis not present

## 2016-07-26 DIAGNOSIS — Z6841 Body Mass Index (BMI) 40.0 and over, adult: Secondary | ICD-10-CM

## 2016-07-26 DIAGNOSIS — I87009 Postthrombotic syndrome without complications of unspecified extremity: Secondary | ICD-10-CM

## 2016-07-26 DIAGNOSIS — D6851 Activated protein C resistance: Secondary | ICD-10-CM

## 2016-07-26 DIAGNOSIS — Z86718 Personal history of other venous thrombosis and embolism: Secondary | ICD-10-CM

## 2016-07-26 DIAGNOSIS — Z86711 Personal history of pulmonary embolism: Secondary | ICD-10-CM

## 2016-07-26 DIAGNOSIS — M23312 Other meniscus derangements, anterior horn of medial meniscus, left knee: Secondary | ICD-10-CM

## 2016-07-26 LAB — CBC WITH DIFFERENTIAL/PLATELET
BASO%: 0.6 % (ref 0.0–2.0)
BASOS ABS: 0 10*3/uL (ref 0.0–0.1)
EOS%: 1.6 % (ref 0.0–7.0)
Eosinophils Absolute: 0.1 10*3/uL (ref 0.0–0.5)
HCT: 38.3 % (ref 34.8–46.6)
HEMOGLOBIN: 12.5 g/dL (ref 11.6–15.9)
LYMPH#: 1.3 10*3/uL (ref 0.9–3.3)
LYMPH%: 32.4 % (ref 14.0–49.7)
MCH: 29.5 pg (ref 25.1–34.0)
MCHC: 32.7 g/dL (ref 31.5–36.0)
MCV: 90.2 fL (ref 79.5–101.0)
MONO#: 0.2 10*3/uL (ref 0.1–0.9)
MONO%: 5.7 % (ref 0.0–14.0)
NEUT#: 2.4 10*3/uL (ref 1.5–6.5)
NEUT%: 59.7 % (ref 38.4–76.8)
PLATELETS: 193 10*3/uL (ref 145–400)
RBC: 4.25 10*6/uL (ref 3.70–5.45)
RDW: 13.6 % (ref 11.2–14.5)
WBC: 4 10*3/uL (ref 3.9–10.3)

## 2016-07-26 LAB — COMPREHENSIVE METABOLIC PANEL
ALK PHOS: 58 U/L (ref 40–150)
ALT: 18 U/L (ref 0–55)
ANION GAP: 10 meq/L (ref 3–11)
AST: 16 U/L (ref 5–34)
Albumin: 3.5 g/dL (ref 3.5–5.0)
BUN: 10.5 mg/dL (ref 7.0–26.0)
CALCIUM: 9.4 mg/dL (ref 8.4–10.4)
CO2: 25 mEq/L (ref 22–29)
Chloride: 107 mEq/L (ref 98–109)
Creatinine: 0.6 mg/dL (ref 0.6–1.1)
EGFR: >90 mL/min/{1.73_m2} (ref >90)
GLUCOSE: 111 mg/dL (ref 70–140)
POTASSIUM: 4.1 meq/L (ref 3.5–5.1)
Sodium: 143 mEq/L (ref 136–145)
Total Bilirubin: 0.36 mg/dL (ref 0.20–1.20)
Total Protein: 7 g/dL (ref 6.4–8.3)

## 2016-07-26 LAB — PROTIME-INR
INR: 3.5 (ref 2.00–3.50)
PROTIME: 42 s — AB (ref 10.6–13.4)

## 2016-07-27 ENCOUNTER — Ambulatory Visit (INDEPENDENT_AMBULATORY_CARE_PROVIDER_SITE_OTHER): Payer: Self-pay | Admitting: Orthopedic Surgery

## 2016-07-28 ENCOUNTER — Ambulatory Visit: Payer: BLUE CROSS/BLUE SHIELD | Attending: Orthopaedic Surgery | Admitting: Physical Therapy

## 2016-07-28 DIAGNOSIS — M25562 Pain in left knee: Secondary | ICD-10-CM | POA: Insufficient documentation

## 2016-07-28 DIAGNOSIS — R262 Difficulty in walking, not elsewhere classified: Secondary | ICD-10-CM

## 2016-07-28 DIAGNOSIS — M6281 Muscle weakness (generalized): Secondary | ICD-10-CM

## 2016-07-28 DIAGNOSIS — M25662 Stiffness of left knee, not elsewhere classified: Secondary | ICD-10-CM

## 2016-07-28 NOTE — Therapy (Signed)
[place], [place], [address] Phone: [phone]   Fax:  [phone]  Physical Therapy Evaluation  Patient Details  Name: Margaret Phillips MRN: [id number] Date of Birth: June 23, 1961 Referring Provider: Joni Fears MD  Encounter Date: 07/28/2016      PT End of Session - 07/28/16 1103    Visit Number 1   Number of Visits 16   Date for PT Re-Evaluation 09/22/16   Authorization Type BC[employer]   Authorization Time Period 09-22-16   PT Start Time 1102   PT Stop Time 1156   PT Time Calculation (min) 54 min   Activity Tolerance Patient tolerated treatment well   Behavior During Therapy [facility] for tasks assessed/performed      Past Medical History:  Diagnosis Date  . Factor V Leiden ([place])   . History of DVT of lower extremity   . History of nephrolithiasis   . History of pulmonary embolism    1987  &  1998  . Hyperlipidemia   . Mild obstructive sleep apnea    per pt study done 2006 (approx.)  mild OSA no cpap recommended  . PTSD (post-traumatic stress disorder)    NIGHT TERRORS  . Renal calculus, left     Past Surgical History:  Procedure Laterality Date  . CERVICAL FUSION  1997   C6 - 7  . CYSTOSCOPY W/ URETERAL STENT PLACEMENT Left 06/18/2015   Procedure: CYSTOSCOPY WITH STENT REPLACEMENT;  Surgeon: Cleon Gustin, MD;  Location: [facility];  Service: Urology;  Laterality: Left;  . CYSTOSCOPY WITH RETROGRADE PYELOGRAM, URETEROSCOPY AND STENT PLACEMENT Left 06/04/2015   Procedure: CYSTOSCOPY WITH RETROGRADE PYELOGRAM,  AND STENT PLACEMENT;  Surgeon: Cleon Gustin, MD;  Location: [facility];  Service: Urology;  Laterality: Left;  . CYSTOSCOPY WITH RETROGRADE PYELOGRAM, URETEROSCOPY AND STENT PLACEMENT Left 07/13/2015   Procedure: CYSTOSCOPY/RETROGRADE/STONE EXTRACTION WITH BASKET AND STENT EXCHANGE/PLACEMENT;  Surgeon: Cleon Gustin, MD;  Location: [facility];  Service:  Urology;  Laterality: Left;  . CYSTOSCOPY WITH URETEROSCOPY Left 06/18/2015   Procedure: CYSTOSCOPY WITH URETEROSCOPY;  Surgeon: Cleon Gustin, MD;  [facility];  Service: Urology;  Laterality: Left;  . CYSTOSCOPY/RETROGRADE/URETEROSCOPY/STONE EXTRACTION WITH BASKET Left 06/18/2015   Procedure: CYSTOSCOPY/RETROGRADE/STONE EXTRACTION WITH BASKET;  Surgeon: Cleon Gustin, MD;  Location: [facility];  Service: Urology;  Laterality: Left;  . HOLMIUM LASER APPLICATION Left [id number]   Procedure: HOLMIUM LASER APPLICATION;  Surgeon: Cleon Gustin, MD;  Location: [facility];  Service: Urology;  Laterality: Left;    There were no vitals filed for this visit.       Subjective Assessment - 07/28/16 1109    Subjective I initially injured my left knee in April 2017,  I was pulling all the pool furniture all myself and I hurt knee but pain progressively got worse. On first of September, I fell down on step when my knee 'gave out.  I then was scheduled for  knee arthroscopy for left medial meniscuse on 07-07-16   Pertinent History FActor 5, history of PE and DVT, Cervical fusion C-5/6,    Diagnostic tests MRI   Patient Stated Goals To be able to walk without assistance with walker and be able go up and down stairs normally without pain   Currently in Pain? Yes   Pain Score 5    Pain Location Knee   Pain Orientation Left   Pain Descriptors /  Indicators Sharp;Discomfort   Pain Type Acute pain;Surgical pain   Pain Onset More than a month ago   Pain Frequency Constant   Aggravating Factors  I cant walk on it or twist knee,    Pain Relieving Factors ice            [facility] PT Assessment - 07/28/16 1121      Assessment   Medical Diagnosis left knee arthroscopy for medial meniscus   Referring Provider Joni Fears MD   Onset Date/Surgical Date 07/07/16   Hand Dominance Right   Prior Therapy none     Precautions   Precautions  Fall   Precaution Comments FActor 5, can only take tylenol , no antiinflammatory , History of  PE, DVT , Allergic to contrast dye, iodine and betadine     Restrictions   Weight Bearing Restrictions Yes   LLE Weight Bearing Weight bearing as tolerated     Balance Screen   Has the patient fallen in the past 6 months Yes   How many times? 1   Has the patient had a decrease in activity level because of a fear of falling?  No   Is the patient reluctant to leave their home because of a fear of falling?  No     Home Ecologist residence   Living Arrangements Spouse/significant other   Available Help at Discharge [place] to enter   Entrance Stairs-Number of Steps 3   Entrance Stairs-Rails None   Home Layout One level     Prior Function   Level of Independence Independent     Cognition   Overall Cognitive Status Within Functional Limits for tasks assessed     Observation/Other Assessments   Focus on Therapeutic Outcomes (FOTO)  FOTO intake 26% llimitation 74%, predicted 52%     Circumferential Edema   Circumferential - Right 46.5cm   Circumferential - Left  50 cm     Sensation   Light Touch Appears Intact     Posture/Postural Control   Posture/Postural Control Postural limitations   Postural Limitations Rounded Shoulders;Forward head;Anterior pelvic tilt;Weight shift right   Posture Comments increased abdominal girth     ROM / Strength   AROM / PROM / Strength AROM;PROM;Strength     AROM   Right Knee Extension -3   Right Knee Flexion 115   Left Knee Extension -15   Left Knee Flexion 93     PROM   Right Knee Extension -3   Right Knee Flexion 120   Left Knee Extension -12  limted by pain   Left Knee Flexion 95  limtied by pain     Strength   Right Hip Flexion 4+/5   Right Hip ABduction 4/5   Left Hip Flexion 4+/5   Left Hip ABduction 4/5   Right Knee Flexion 4+/5   Right Knee Extension 4+/5   Left Knee Flexion  4/5   Left Knee Extension 4-/5     Flexibility   Hamstrings 55 bil     Palpation   Palpation comment marked tenderness over medial knee over surgical site and medial retinalculum     Ambulation/Gait   Ambulation/Gait Yes   Assistive device 4-wheeled walker   Gait Pattern Step-to pattern;Antalgic   Ambulation Surface Level   Gait velocity 1.41ft/sec                   [facility] Adult PT Treatment/Exercise - 07/28/16 1121  Knee/Hip Exercises: Seated   Long Arc Quad Strengthening;Left;10 reps   Long Arc Quad Limitations slight quad lag < 10 degrees     Knee/Hip Exercises: Supine   Quad Sets 10 reps;Left;Strengthening   Quad Sets Limitations decreased sup mobility of patella   Short Arc [employer] 10 reps;Left;Strengthening   Short Arc Quad Sets Limitations pain limitiing   Heel Slides Left;Strengthening;10 reps   Heel Slides Limitations pain limitiing   Straight Leg Raises 10 reps;Strengthening;Left   Straight Leg Raises Limitations pain limiting     Vasopneumatic   Number Minutes Vasopneumatic  15 minutes   Vasopnuematic Location  Knee   Vasopneumatic Pressure Medium   Vasopneumatic Temperature  32 degrees                PT Education - 07/28/16 1325    Education provided Yes   Education Details POC, explanation of findings. cryotherapy, level 1 knee exericises   Person(s) Educated Patient   Methods Explanation;Demonstration;Verbal cues;Handout;Tactile cues   Comprehension Verbalized understanding;Returned demonstration          PT Short Term Goals - 07/28/16 1308      PT SHORT TERM GOAL #1   Title "Independent with initial HEP   Time 4   Period Weeks   Status New     PT SHORT TERM GOAL #2   Title "Report pain decrease at rest from   5/10 to   2/10.   Time 4   Period Weeks   Status New     PT SHORT TERM GOAL #3   Title "Demonstrate and verbalize understanding of condition management including RICE, positioning, use of A.D., HEP to  reduce pain and edema   Time 4   Period Weeks   Status New     PT SHORT TERM GOAL #4   Title Utilize walker without limping and using step through technique and heel toe contact   Time 4   Period Weeks   Status New           PT Long Term Goals - 07/28/16 1310      PT LONG TERM GOAL #1   Title Pt will be independent with advanced home exercise program   Time 8   Period Weeks   Status New     PT LONG TERM GOAL #2   Title "Pain will decrease to 2/10 or less with all functional activities   Time 8   Period Weeks   Status New     PT LONG TERM GOAL #3   Title Left knee AAROM flexion will improve to 3-115 degrees for improved mobility to ride in car without increased pain   Time 8   Period Weeks   Status New     PT LONG TERM GOAL #4   Title Pt will be able to ambulate with LRAD at least 2.82ft/sec to indicate improved mobility   Time 8   Period Weeks   Status New     PT LONG TERM GOAL #5   Title "FOTO will improve from  74%limitation  to  52%limitation   indicating improved functional mobility .    Time 8   Period Weeks   Status New     Additional Long Term Goals   Additional Long Term Goals Yes     PT LONG TERM GOAL #6   Title Pt will be able to ascend/ descend steps with step over step technique and 2/10 or less pain in left knee  Time 8   Period Weeks   Status New               Plan - 07/28/16 1317    Clinical Impression Statement 55 yo female with 9 month history or left knee pain and knee arthroscopy for left medical meniscus on    07/07/16 by Dr Durward Fortes.  Marland Kitchen Pt presents with impairments including pain,edema, knee weakness, impaired ROM, difficulty with walking, stairs, and with transfers . Pt would benefit from skilled PT for 2 times a week for 8 weeks to address above impariments and functional limitations and return to zero to minimal pain PLOF   Rehab Potential Good   Clinical Impairments Affecting Rehab Potential Factor 5, Cannot take anti  inflammatory , only tylenol.  history or PE and DVT   PT Frequency 2x / week   PT Duration 8 weeks   PT Treatment/Interventions ADLs/Self Care Home Management;Cryotherapy;Health visitor;Taping;Dry needling;Manual techniques;Passive range of motion;Patient/family education;Neuromuscular re-education;Therapeutic exercise;Ultrasound;Moist Heat;Gait training;Stair training   PT Next Visit Plan review level 1 knee exercise, gait training   PT Home Exercise Plan cryotherapy, level 1 knee exericises      Patient will benefit from skilled therapeutic intervention in order to improve the following deficits and impairments:  Difficulty walking, Pain, Decreased mobility, Decreased strength, Decreased range of motion, Increased edema, Postural dysfunction, Increased muscle spasms, Obesity  Visit Diagnosis: Acute pain of left knee  Stiffness of left knee, not elsewhere classified  Difficulty in walking, not elsewhere classified  Muscle weakness (generalized)     Problem List Patient Active Problem List   Diagnosis Date Noted  . Renal hemorrhage, left 06/24/2015    Voncille Lo, PT Exercise Expert for the Aging Adult  07/28/16 1:29 PM Phone: [phone] Fax: [place] [facility] [address] [place], [place], [address] Phone: [phone]   Fax:  [phone]  Name: JANUS RAAD MRN: [id number] Date of Birth: 1961-02-12

## 2016-07-28 NOTE — Patient Instructions (Addendum)
Cryotherapy Cryotherapy means treatment with cold. Ice or gel packs can be used to reduce both pain and swelling. Ice is the most helpful within the first 24 to 48 hours after an injury or flare-up from overusing a muscle or joint. Sprains, strains, spasms, burning pain, shooting pain, and aches can all be eased with ice. Ice can also be used when recovering from surgery. Ice is effective, has very few side effects, and is safe for most people to use. PRECAUTIONS  Ice is not a safe treatment option for people with:  Raynaud phenomenon. This is a condition affecting small blood vessels in the extremities. Exposure to cold may cause your problems to return.  Cold hypersensitivity. There are many forms of cold hypersensitivity, including:  Cold urticaria. Red, itchy hives appear on the skin when the tissues begin to warm after being iced.  Cold erythema. This is a red, itchy rash caused by exposure to cold.  Cold hemoglobinuria. Red blood cells break down when the tissues begin to warm after being iced. The hemoglobin that carry oxygen are passed into the urine because they cannot combine with blood proteins fast enough.  Numbness or altered sensitivity in the area being iced. If you have any of the following conditions, do not use ice until you have discussed cryotherapy with your caregiver:  Heart conditions, such as arrhythmia, angina, or chronic heart disease.  High blood pressure.  Healing wounds or open skin in the area being iced.  Current infections.  Rheumatoid arthritis.  Poor circulation.  Diabetes. Ice slows the blood flow in the region it is applied. This is beneficial when trying to stop inflamed tissues from spreading irritating chemicals to surrounding tissues. However, if you expose your skin to cold temperatures for too long or without the proper protection, you can damage your skin or nerves. Watch for signs of skin damage due to cold. HOME CARE INSTRUCTIONS Follow  these tips to use ice and cold packs safely.  Place a dry or damp towel between the ice and skin. A damp towel will cool the skin more quickly, so you may need to shorten the time that the ice is used.  For a more rapid response, add gentle compression to the ice.  Ice for no more than 10 to 20 minutes at a time. The bonier the area you are icing, the less time it will take to get the benefits of ice.  Check your skin after 5 minutes to make sure there are no signs of a poor response to cold or skin damage.  Rest 20 minutes or more between uses.  Once your skin is numb, you can end your treatment. You can test numbness by very lightly touching your skin. The touch should be so light that you do not see the skin dimple from the pressure of your fingertip. When using ice, most people will feel these normal sensations in this order: cold, burning, aching, and numbness.  Do not use ice on someone who cannot communicate their responses to pain, such as small children or people with dementia. HOW TO MAKE AN ICE PACK Ice packs are the most common way to use ice therapy. Other methods include ice massage, ice baths, and cryosprays. Muscle creams that cause a cold, tingly feeling do not offer the same benefits that ice offers and should not be used as a substitute unless recommended by your caregiver. To make an ice pack, do one of the following:  Place crushed ice or a  bag of frozen vegetables in a sealable plastic bag. Squeeze out the excess air. Place this bag inside another plastic bag. Slide the bag into a pillowcase or place a damp towel between your skin and the bag.  Mix 3 parts water with 1 part rubbing alcohol. Freeze the mixture in a sealable plastic bag. When you remove the mixture from the freezer, it will be slushy. Squeeze out the excess air. Place this bag inside another plastic bag. Slide the bag into a pillowcase or place a damp towel between your skin and the bag. SEEK MEDICAL CARE  IF:  You develop white spots on your skin. This may give the skin a blotchy (mottled) appearance.  Your skin turns blue or pale.  Your skin becomes waxy or hard.  Your swelling gets worse. MAKE SURE YOU:   Understand these instructions.  Will watch your condition.  Will get help right away if you are not doing well or get worse. Document Released: 05/30/2011 Document Revised: 02/17/2014 Document Reviewed: 05/30/2011 [facility] Patient Information 2015 [place], [place]. This information is not intended to replace advice given to you by your health care provider. Make sure you discuss any questions you have with your health care provider.    Copyright  VHI. All rights reserved.  HIP: Flexion / KNEE: Extension, Straight Leg Raise   Raise leg, keeping knee straight. Perform slowly. _15__ reps per set, _2__ sets per day, _6-7__ days per week   Copyright  VHI. All rights reserved.  Heel Slide   Bend knee and pull heel toward buttocks. Hold _3-5_ seconds. Return. Repeat with other knee. Repeat 15 x2____ times. Do _1___ sessions per day.  http://gt2.exer.us/372   Copyright  VHI. All rights reserved.     Raise leg until knee is straight. _15__ reps per set, _2__ sets per day, _6-7__ days per week  Copyright  VHI. All rights reserved.  Short Arc [employer] a large can or rolled towel under leg. Straighten knee and leg. Hold __3-5__ seconds. Repeat with other leg. Repeat _15 x2___ times. Do _1___ sessions per day.  http://gt2.exer.us/365   Copyright  VHI. All rights reserved.  Quad Set   Slowly tighten muscles on thigh of straight leg while counting out loud to _5___. Repeat with other leg. Repeat _20___ times. Do _3___ sessions per day.  http://gt2.exer.us/361   Copyright  VHI. All rights reserved.  Voncille Lo, PT Exercise Expert for the Aging Adult  07/28/16 11:13 AM Phone: [phone] Fax: [phone]

## 2016-08-03 ENCOUNTER — Ambulatory Visit: Payer: BLUE CROSS/BLUE SHIELD | Admitting: Physical Therapy

## 2016-08-03 DIAGNOSIS — M25562 Pain in left knee: Secondary | ICD-10-CM | POA: Diagnosis not present

## 2016-08-03 DIAGNOSIS — M6281 Muscle weakness (generalized): Secondary | ICD-10-CM

## 2016-08-03 DIAGNOSIS — R262 Difficulty in walking, not elsewhere classified: Secondary | ICD-10-CM

## 2016-08-03 DIAGNOSIS — M25662 Stiffness of left knee, not elsewhere classified: Secondary | ICD-10-CM

## 2016-08-03 NOTE — Patient Instructions (Signed)
Knee flexion, yellow band 2 X a day 10 -30 X each, daily

## 2016-08-03 NOTE — Therapy (Signed)
[place], [place], [address] Phone: [phone]   Fax:  [phone]  Physical Therapy Treatment  Patient Details  Name: Margaret Phillips MRN: [id number] Date of Birth: 08/11/1961 Referring Provider: Joni Fears MD  Encounter Date: 08/03/2016      PT End of Session - 08/03/16 1547    Visit Number 2   Number of Visits 16   Date for PT Re-Evaluation 09/22/16   PT Start Time 1503   PT Stop Time 1557   PT Time Calculation (min) 54 min   Activity Tolerance Patient tolerated treatment well   Behavior During Therapy [facility] for tasks assessed/performed      Past Medical History:  Diagnosis Date  . Factor V Leiden ([place])   . History of DVT of lower extremity   . History of nephrolithiasis   . History of pulmonary embolism    1987  &  1998  . Hyperlipidemia   . Mild obstructive sleep apnea    per pt study done 2006 (approx.)  mild OSA no cpap recommended  . PTSD (post-traumatic stress disorder)    NIGHT TERRORS  . Renal calculus, left     Past Surgical History:  Procedure Laterality Date  . CERVICAL FUSION  1997   C6 - 7  . CYSTOSCOPY W/ URETERAL STENT PLACEMENT Left 06/18/2015   Procedure: CYSTOSCOPY WITH STENT REPLACEMENT;  Surgeon: Cleon Gustin, MD;  Location: [facility];  Service: Urology;  Laterality: Left;  . CYSTOSCOPY WITH RETROGRADE PYELOGRAM, URETEROSCOPY AND STENT PLACEMENT Left 06/04/2015   Procedure: CYSTOSCOPY WITH RETROGRADE PYELOGRAM,  AND STENT PLACEMENT;  Surgeon: Cleon Gustin, MD;  Location[facility];  Service: Urology;  Laterality: Left;  . CYSTOSCOPY WITH RETROGRADE PYELOGRAM, URETEROSCOPY AND STENT PLACEMENT Left 07/13/2015   Procedure: CYSTOSCOPY/RETROGRADE/STONE EXTRACTION WITH BASKET AND STENT EXCHANGE/PLACEMENT;  Surgeon: Cleon Gustin, MD;  Location: [facility];  Service: Urology;  Laterality: Left;  . CYSTOSCOPY WITH URETEROSCOPY Left  06/18/2015   Procedure: CYSTOSCOPY WITH URETEROSCOPY;  Surgeon: Cleon Gustin, MD;  Location[facility];  Service: Urology;  Laterality: Left;  . CYSTOSCOPY/RETROGRADE/URETEROSCOPY/STONE EXTRACTION WITH BASKET Left 06/18/2015   Procedure: CYSTOSCOPY/RETROGRADE/STONE EXTRACTION WITH BASKET;  Surgeon: Cleon Gustin, MD;  Location: [facility];  Service: Urology;  Laterality: Left;  . HOLMIUM LASER APPLICATION Left 01/24/4495   Procedure: HOLMIUM LASER APPLICATION;  Surgeon: Cleon Gustin, MD;  [facility];  Service: Urology;  Laterality: Left;    There were no vitals filed for this visit.      Subjective Assessment - 08/03/16 1504    Subjective Sleeping is difficult.  I feel exhausted from walking the way I do.  I feel it may be stuck.  Uses cane most of the time.   I am overusing (3/10)my good knee knee   Currently in Pain? Yes   Pain Score 4    Pain Location Knee   Pain Orientation Left   Pain Descriptors / Indicators Burning  locks up,  discomfort most of the day   Pain Type Acute pain;Surgical pain   Pain Frequency Constant   Aggravating Factors  sleeping, can't get comfortable.  walking   Pain Relieving Factors ice, tylenol   Multiple Pain Sites No                         [facility] Adult PT Treatment/Exercise - 08/03/16 0001  Knee/Hip Exercises: Seated   Long Arc Quad 1 set;10 reps   Heel Slides 1 set;10 reps   Hamstring Curl 1 set;10 reps   Hamstring Limitations Yellow band , HEP     Knee/Hip Exercises: Supine   Quad Sets 10 reps   Quad Sets Limitations needed patellar mobilization prior   Short Arc [employer] 10 reps   Short Arc Quad Sets Limitations painful    Straight Leg Raises Limitations fatigued vs pain 8 x   Patellar Mobs stiff initially     Vasopneumatic   Number Minutes Vasopneumatic  15 minutes   Vasopnuematic Location  Knee   Vasopneumatic Pressure Medium   Vasopneumatic  Temperature  32                  PT Short Term Goals - 08/03/16 1549      PT SHORT TERM GOAL #1   Title "Independent with initial HEP   Baseline cues   Time 4   Period Weeks   Status On-going     PT SHORT TERM GOAL #2   Title "Report pain decrease at rest from   5/10 to   2/10.   Baseline 4/10   Time 4   Period Weeks   Status On-going     PT SHORT TERM GOAL #3   Title "Demonstrate and verbalize understanding of condition management including RICE, positioning, use of A.D., HEP to reduce pain and edema   Baseline understands RICE   Time 4   Period Weeks   Status On-going     PT SHORT TERM GOAL #4   Title Utilize walker without limping and using step through technique and heel toe contact   Baseline cane today with limp   Time 4   Period Weeks           PT Long Term Goals - 07/28/16 1310      PT LONG TERM GOAL #1   Title Pt will be independent with advanced home exercise program   Time 8   Period Weeks   Status New     PT LONG TERM GOAL #2   Title "Pain will decrease to 2/10 or less with all functional activities   Time 8   Period Weeks   Status New     PT LONG TERM GOAL #3   Title Left knee AAROM flexion will improve to 3-115 degrees for improved mobility to ride in car without increased pain   Time 8   Period Weeks   Status New     PT LONG TERM GOAL #4   Title Pt will be able to ambulate with LRAD at least 2.70f/sec to indicate improved mobility   Time 8   Period Weeks   Status New     PT LONG TERM GOAL #5   Title "FOTO will improve from  74%limitation  to  52%limitation   indicating improved functional mobility .    Time 8   Period Weeks   Status New     Additional Long Term Goals   Additional Long Term Goals Yes     PT LONG TERM GOAL #6   Title Pt will be able to ascend/ descend steps with step over step technique and 2/10 or less pain in left knee   Time 8   Period Weeks   Status New               Plan - 08/03/16  1547    Clinical Impression  Statement able to do 3 SLR without quad lag.  Patella stiff initially.  Progressed HEP.  No New goals met.  Less pain, less limp post session.   PT Next Visit Plan review level 1 knee exercise, gait training   PT Home Exercise Plan cryotherapy, level 1 knee exericises,  Knee band , flexion   Consulted and Agree with Plan of Care Patient      Patient will benefit from skilled therapeutic intervention in order to improve the following deficits and impairments:  Difficulty walking, Pain, Decreased mobility, Decreased strength, Decreased range of motion, Increased edema, Postural dysfunction, Increased muscle spasms, Obesity  Visit Diagnosis: Acute pain of left knee  Stiffness of left knee, not elsewhere classified  Difficulty in walking, not elsewhere classified  Muscle weakness (generalized)     Problem List Patient Active Problem List   Diagnosis Date Noted  . Renal hemorrhage, left 06/24/2015    Mike Hamre  PTA 08/03/2016, 3:51 PM  [facility] [address] [place], [place], [address] Phone: [phone]   Fax:  [phone]  Name: Margaret Phillips MRN: [id number] Date of Birth: 1961-07-12

## 2016-08-05 ENCOUNTER — Encounter: Payer: Self-pay | Admitting: Physical Therapy

## 2016-08-05 ENCOUNTER — Ambulatory Visit: Payer: BLUE CROSS/BLUE SHIELD | Admitting: Physical Therapy

## 2016-08-05 DIAGNOSIS — M25562 Pain in left knee: Secondary | ICD-10-CM | POA: Diagnosis not present

## 2016-08-05 DIAGNOSIS — R262 Difficulty in walking, not elsewhere classified: Secondary | ICD-10-CM

## 2016-08-05 DIAGNOSIS — M6281 Muscle weakness (generalized): Secondary | ICD-10-CM

## 2016-08-05 DIAGNOSIS — M25662 Stiffness of left knee, not elsewhere classified: Secondary | ICD-10-CM

## 2016-08-05 NOTE — Therapy (Signed)
[place], [place], [address] Phone: [phone]   Fax:  [phone]  Physical Therapy Treatment  Patient Details  Name: [name] MRN: [id number] Date of Birth: 05/19/1961 Referring Provider: Joni Fears MD  Encounter Date: 08/05/2016      PT End of Session - 08/05/16 1106    Visit Number 3   Number of Visits 16   Date for PT Re-Evaluation 09/22/16   Authorization Type BC[employer]   Authorization Time Period 09-22-16   PT Start Time 1104   PT Stop Time 1155   PT Time Calculation (min) 51 min   Activity Tolerance Patient tolerated treatment well   Behavior During Therapy [facility] for tasks assessed/performed      Past Medical History:  Diagnosis Date  . Factor V Leiden ([place])   . History of DVT of lower extremity   . History of nephrolithiasis   . History of pulmonary embolism    1987  &  1998  . Hyperlipidemia   . Mild obstructive sleep apnea    per pt study done 2006 (approx.)  mild OSA no cpap recommended  . PTSD (post-traumatic stress disorder)    NIGHT TERRORS  . Renal calculus, left     Past Surgical History:  Procedure Laterality Date  . CERVICAL FUSION  1997   C6 - 7  . CYSTOSCOPY W/ URETERAL STENT PLACEMENT Left 06/18/2015   Procedure: CYSTOSCOPY WITH STENT REPLACEMENT;  Surgeon: Cleon Gustin, MD;  Location: [facility];  Service: Urology;  Laterality: Left;  . CYSTOSCOPY WITH RETROGRADE PYELOGRAM, URETEROSCOPY AND STENT PLACEMENT Left 06/04/2015   Procedure: CYSTOSCOPY WITH RETROGRADE PYELOGRAM,  AND STENT PLACEMENT;  Surgeon: Cleon Gustin, MD;  Location: [facility];  Service: Urology;  Laterality: Left;  . CYSTOSCOPY WITH RETROGRADE PYELOGRAM, URETEROSCOPY AND STENT PLACEMENT Left 07/13/2015   Procedure: CYSTOSCOPY/RETROGRADE/STONE EXTRACTION WITH BASKET AND STENT EXCHANGE/PLACEMENT;  Surgeon: Cleon Gustin, MD;  Location[facility];  Service:  Urology;  Laterality: Left;  . CYSTOSCOPY WITH URETEROSCOPY Left 06/18/2015   Procedure: CYSTOSCOPY WITH URETEROSCOPY;  Surgeon: Cleon Gustin, MD;  [facility]  Service: Urology;  Laterality: Left;  . CYSTOSCOPY/RETROGRADE/URETEROSCOPY/STONE EXTRACTION WITH BASKET Left 06/18/2015   Procedure: CYSTOSCOPY/RETROGRADE/STONE EXTRACTION WITH BASKET;  Surgeon: Cleon Gustin, MD;  Location: [facility];  Service: Urology;  Laterality: Left;  . HOLMIUM LASER APPLICATION Left [id number]   Procedure: HOLMIUM LASER APPLICATION;  Surgeon: Cleon Gustin, MD;  Location: [facility];  Service: Urology;  Laterality: Left;    There were no vitals filed for this visit.      Subjective Assessment - 08/05/16 1110    Subjective Has been keeping leg straight as long as possible while sitting in recliner. Uses cane to improve gait   Currently in Pain? Yes   Pain Score 3    Pain Location Knee   Pain Orientation Left   Pain Descriptors / Indicators Aching                         [facility] Adult PT Treatment/Exercise - 08/05/16 0001      Exercises   Exercises Knee/Hip     Knee/Hip Exercises: Stretches   Passive Hamstring Stretch 2 reps;30 seconds;Both   Passive Hamstring Stretch Limitations seated EOB with strap   Other Knee/Hip Stretches heel prop for knee ext 2x3' seated EOB to chair  Knee/Hip Exercises: Aerobic   Nustep 5' L5     Knee/Hip Exercises: Supine   Short Arc Quad Sets Limitations 2#, done bilaterally 3 min   Straight Leg Raises Limitations 3 min 2#     Vasopneumatic   Number Minutes Vasopneumatic  15 minutes   Vasopnuematic Location  Knee   Vasopneumatic Pressure Medium   Vasopneumatic Temperature  32                  PT Short Term Goals - 08/03/16 1549      PT SHORT TERM GOAL #1   Title "Independent with initial HEP   Baseline cues   Time 4   Period Weeks   Status On-going     PT  SHORT TERM GOAL #2   Title "Report pain decrease at rest from   5/10 to   2/10.   Baseline 4/10   Time 4   Period Weeks   Status On-going     PT SHORT TERM GOAL #3   Title "Demonstrate and verbalize understanding of condition management including RICE, positioning, use of A.D., HEP to reduce pain and edema   Baseline understands RICE   Time 4   Period Weeks   Status On-going     PT SHORT TERM GOAL #4   Title Utilize walker without limping and using step through technique and heel toe contact   Baseline cane today with limp   Time 4   Period Weeks           PT Long Term Goals - 07/28/16 1310      PT LONG TERM GOAL #1   Title Pt will be independent with advanced home exercise program   Time 8   Period Weeks   Status New     PT LONG TERM GOAL #2   Title "Pain will decrease to 2/10 or less with all functional activities   Time 8   Period Weeks   Status New     PT LONG TERM GOAL #3   Title Left knee AAROM flexion will improve to 3-115 degrees for improved mobility to ride in car without increased pain   Time 8   Period Weeks   Status New     PT LONG TERM GOAL #4   Title Pt will be able to ambulate with LRAD at least 2.64ft/sec to indicate improved mobility   Time 8   Period Weeks   Status New     PT LONG TERM GOAL #5   Title "FOTO will improve from  74%limitation  to  52%limitation   indicating improved functional mobility .    Time 8   Period Weeks   Status New     Additional Long Term Goals   Additional Long Term Goals Yes     PT LONG TERM GOAL #6   Title Pt will be able to ascend/ descend steps with step over step technique and 2/10 or less pain in left knee   Time 8   Period Weeks   Status New               Plan - 08/05/16 1135    Clinical Impression Statement Significant difficulty with SLR and fatigue in SAQ. Reported feeling "better" after stretching and exercises.       Patient will benefit from skilled therapeutic intervention in  order to improve the following deficits and impairments:     Visit Diagnosis: Acute pain of left knee  Stiffness of left  knee, not elsewhere classified  Difficulty in walking, not elsewhere classified  Muscle weakness (generalized)     Problem List Patient Active Problem List   Diagnosis Date Noted  . Renal hemorrhage, left 06/24/2015   Skyley Grandmaison C. Cadon Raczka PT, DPT 08/05/16 11:43 AM   [place] [facility] [address] [place], [place], [address] Phone: [phone]   Fax:  [phone]  Name: Margaret Phillips MRN: [id number] Date of Birth: 1961-02-19

## 2016-08-08 NOTE — Progress Notes (Signed)
[facility]    HEMATOLOGY/ONCOLOGY CLINIC NOTE  Date of Service: .07/26/2016  Patient Care Team: Provider Not In System as PCP - General  CHIEF COMPLAINTS/PURPOSE OF CONSULTATION:  Factor V leiden mutation  HISTORY OF PRESENTING ILLNESS:   Margaret Phillips is a wonderful 55 y.o. 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  Patient reports that she had a right lower extremity DVT following a bunionectomy with the cast on her right lower extremity and while being on oral contraceptives in 1989 which was characterized by right lower extremity pain. It was not picked up immediately and a couple of months later she presented with a syncopal episode and reports having had a cardiac arrest and was finally noted to have significant bilateral pulmonary embolism. Patient reports that she was on IV heparin followed by Coumadin which was continued for 1 year. Her oral contraceptives were discontinued. She had been on them for about 7 years prior to her DVT/PE.  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 She was restarted on Coumadin and has been on Coumadin ever since then.  She notes that she was a part of the study at [facility] in the late 90s when she was diagnosed with having factor V Leiden mutation. She is unclear if this was heterozygous or homozygous mutation though the former is much more likely.  Patient reports that she has had no issues with tolerating the Coumadin and no significant issues with bleeding. She was followed by Dr. Larey Days hematology in [facility].   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 She reports that she recently misstepped and her left knee pain without much worse and that she hasn't been able to ambulate much and has to use a walker. Patient was seen by orthopedics and had an MRI of her knee on 06/21/2016 which showed a complex tear involving the posterior horn and mid body junction region of the medial meniscus.  She was recommended arthroscopic surgery and is here for perioperative management of her factor V Leiden and anticoagulation.  Patient also reported some left calf pain and had an ultrasound of the left lower extremity which showed no evidence of DVT.  Patient notes that she's had previous discussion with her other physicians and have decided to continue on warfarin for chronic anticoagulation instead of the other NOAC's. She notes that she is currently taking 15 mg of warfarin on Monday Wednesday and Friday and 10 mg the other days and that her INR has been fairly stable and is currently being monitored every 4 weeks. She has previously taken Lovenox shots for bridging anticoagulation and is able to do this without any problems.  Patient has never been a smoker.. Notes that her dad had DVTs and multiple aneurysms.  INTERVAL HISTORY  Ms Poorman is here for her post-surgical followup. She notes that her surgery went well and she is getting around much better with less knee pain. No new leg swelling. Was able to management anticoagulation without any issues.   MEDICAL HISTORY:  Past Medical History:  Diagnosis  Date  . Factor V Leiden ([place])   . History of DVT of lower extremity   . History of nephrolithiasis   . History of pulmonary embolism    1987  &  1998  . Hyperlipidemia   . Mild obstructive sleep apnea    per pt study done 2006 (approx.)  mild OSA no cpap recommended  . PTSD (post-traumatic stress disorder)    NIGHT TERRORS  . Renal calculus, left   Morbid  obesity .Body mass index is 48.16 kg/m.  History of factor V Leiden mutation History of recurrent DVT and PE as detailed above. Both the episodes appear to have been triggered , although the underlying factor V Leiden mutation was a risk factor in addition.  Restless leg syndrome Cervical spinal fusion History of recurrent urinary stones Urgent incontinence  SURGICAL HISTORY: Past Surgical History:  Procedure Laterality Date  . CERVICAL FUSION  1997   C6 - 7  . CYSTOSCOPY W/ URETERAL STENT PLACEMENT Left 06/18/2015   Procedure: CYSTOSCOPY WITH STENT REPLACEMENT;  Surgeon: Cleon Gustin, MD;  Location[facility];  Service: Urology;  Laterality: Left;  . CYSTOSCOPY WITH RETROGRADE PYELOGRAM, URETEROSCOPY AND STENT PLACEMENT Left 06/04/2015   Procedure: CYSTOSCOPY WITH RETROGRADE PYELOGRAM,  AND STENT PLACEMENT;  Surgeon: Cleon Gustin, MD;  Location: [facility];  Service: Urology;  Laterality: Left;  . CYSTOSCOPY WITH RETROGRADE PYELOGRAM, URETEROSCOPY AND STENT PLACEMENT Left 07/13/2015   Procedure: CYSTOSCOPY/RETROGRADE/STONE EXTRACTION WITH BASKET AND STENT EXCHANGE/PLACEMENT;  Surgeon: Cleon Gustin, MD;  Location: [facility];  Service: Urology;  Laterality: Left;  . CYSTOSCOPY WITH URETEROSCOPY Left 06/18/2015   Procedure: CYSTOSCOPY WITH URETEROSCOPY;  Surgeon: Cleon Gustin, MD;  Location: [facility];  Service: Urology;  Laterality: Left;  . CYSTOSCOPY/RETROGRADE/URETEROSCOPY/STONE EXTRACTION WITH BASKET Left 06/18/2015   Procedure: CYSTOSCOPY/RETROGRADE/STONE EXTRACTION WITH BASKET;  Surgeon: Cleon Gustin, MD;  Location: [facility];  Service: Urology;  Laterality: Left;  . HOLMIUM LASER APPLICATION Left [id number]   Procedure: HOLMIUM LASER APPLICATION;  Surgeon: Cleon Gustin, MD;  Location: [facility];  Service: Urology;  Laterality: Left;    SOCIAL HISTORY: Social History    Social History  . Marital status: Married    Spouse name: N/A  . Number of children: N/A  . Years of education: N/A   Occupational History  . Not on file.   Social History Main Topics  . Smoking status: Never Smoker  . Smokeless tobacco: Never Used  . Alcohol use 1.8 oz/week    3 Glasses of wine per week     Comment: weekends only  . Drug use: No  . Sexual activity: Not on file   Other Topics Concern  . Not on file   Social History Narrative  . No narrative on file    FAMILY HISTORY: History reviewed. No pertinent family history.  ALLERGIES:  is allergic to betadine [povidone iodine]; contrast media [iodinated diagnostic agents]; iodine; and latex.  MEDICATIONS:  Current Outpatient Prescriptions  Medication Sig Dispense Refill  . acetaminophen (TYLENOL) 500 MG tablet Take 1,000 mg by mouth every 6 (six) hours as needed for fever.    Marland Kitchen atorvastatin (LIPITOR) 10 MG tablet Take 10 mg by mouth at bedtime.  4  . clonazePAM (KLONOPIN) 0.5 MG tablet Take 0.5 mg by mouth 2 (two) times daily as needed for anxiety.    Marland Kitchen oxybutynin (DITROPAN-XL) 10 MG 24 hr tablet TK 1 T PO QD  3  . prazosin (MINIPRESS) 2 MG capsule Take 2 mg by mouth at bedtime.   7  . sertraline (ZOLOFT) 100 MG tablet Take 100 mg by mouth at bedtime.    Marland Kitchen warfarin (COUMADIN) 5 MG tablet Take 10 mg by mouth daily.      No current facility-administered medications for this visit.     REVIEW OF SYSTEMS:    10 Point review of Systems was done is negative except as noted above.  PHYSICAL EXAMINATION: ECOG PERFORMANCE STATUS: 2 - Symptomatic, <50% confined to bed  . Vitals:   07/26/16 1005  BP: (!) 120/52  Pulse: 76  Resp: 20  Temp: 98.2 F (36.8 C)   Filed Weights   07/26/16 1005  Weight: 298 lb 6.4 oz (135.4 kg)   .Body mass index is 48.16 kg/m.  GENERAL:alert, in no acute distress and comfortable SKIN: skin color, texture, turgor are normal, no rashes or significant lesions EYES: normal,  conjunctiva are pink and non-injected, sclera clear OROPHARYNX:no exudate, no erythema and lips, buccal mucosa, and tongue normal  NECK: supple, no JVD, thyroid normal size, non-tender, without nodularity LYMPH:  no palpable lymphadenopathy in the cervical, axillary or inguinal LUNGS: clear to auscultation with normal respiratory effort HEART: regular rate & rhythm,  no murmurs and no lower extremity edema ABDOMEN: abdomen soft, non-tender, normoactive bowel sounds  Musculoskeletal: no cyanosis of digits and no clubbing  PSYCH: alert & oriented x 3 with fluent speech NEURO: no focal motor/sensory deficits  LABORATORY DATA:  I have reviewed the data as listed  . CBC Latest Ref Rng & Units 07/26/2016 06/23/2016 06/25/2015  WBC 3.9 - 10.3 10e3/uL 4.0 4.7 7.8  Hemoglobin 11.6 - 15.9 g/dL 12.5 13.9 11.5(L)  Hematocrit 34.8 - 46.6 % 38.3 42.0 35.4(L)  Platelets 145 - 400 10e3/uL 193 177 211    . CMP Latest Ref Rng & Units 07/26/2016 06/23/2016 06/25/2015  Glucose 70 - 140 mg/dl 111 94 114(H)  BUN 7.0 - 26.0 mg/dL 10.5 10.0 19  Creatinine 0.6 - 1.1 mg/dL 0.6 0.7 1.00  Sodium 136 - 145 mEq/L 143 139 136  Potassium 3.5 - 5.1 mEq/L 4.1 4.2 4.5  Chloride 101 - 111 mmol/L - - 105  CO2 22 - 29 mEq/L 25 24 23   Calcium 8.4 - 10.4 mg/dL 9.4 10.1 9.0  Total Protein 6.4 - 8.3 g/dL 7.0 7.7 7.2  Total Bilirubin 0.20 - 1.20 mg/dL 0.36 0.72 0.5  Alkaline Phos 40 - 150 U/L 58 50 53  AST 5 - 34 U/L 16 23 24   ALT 0 - 55 U/L 18 24 19    Factor V Leiden Comment    Comments: RESULT: SINGLE R506Q MUTATION IDENTIFIED (HETEROZYGOTE)  Factor V Leiden is a specific mutation (R506Q) in the factor V gene  that is associated with an increased risk of venous thrombosis.      .Body mass index is 48.16 kg/m.  RADIOGRAPHIC STUDIES: I have personally reviewed the radiological images as listed and agreed with the findings in the report. No results found.  ASSESSMENT & PLAN:   Patient is a 55 year old Caucasian  female with history of recurrent DVT/PE and known factor V Leiden mutation here for preoperative evaluation prior to planned knee surgery.  1) Left knee acute on chronic pain with significant limitation of range of motion due to complex medial meniscal tear which apparently needs arthroscopic surgery. Patient is s/p surgery and doing well.  2)Heterozygous factor V Leiden mutation - likely heterozygous though results  are not available to [place] and was done in the 1990s at [facility]. We shall repeat the test today to confirm this. 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 The events were triggered initially by immobility in a cast and  oral contraceptives and the second time by long distance driving. Her pulmonary embolism however cause what sounds like cardiac arrest possibly PEA and she has been recommended lifelong anticoagulation. Her morbid obesity is certainly an additional acquired risk factor. Patient is a nonsmoker.  3) RLE post-thrombotic syndrome.  4) morbid obesity  .Body mass index is 48.16 kg/m.  5) Sleep apnea - likely need a repeat assessment with her primary care physician . PLAN -patient has done well with her arthroscopic knee surgery and tolerated transition of anticoagulation per plan well. -she notes the knee feels much better. -no other acute new symptoms at this time. -we discussed the findings of the FVL mutation testing and her questions were answered. -she will continue to be on her coumadin for VTE prophylaxis and continue to f/u with her PCP.  We can shall the patient back to her PCP Plz re-consult [place] if any new questions or concerns arise.  All of the patients questions were answered with apparent satisfaction. The patient knows to call the clinic with any problems, questions or concerns.  I spent 15 minutes counseling the patient face to face. The total time  spent in the appointment was 20 minutes and more than 50% was on counseling and direct patient cares.    Sullivan Lone MD [place] AAHIVMS [facility] Hematology/Oncology [facility]  (Office):       [phone] (Work cell):  [phone] (Fax):           [phone]

## 2016-08-09 ENCOUNTER — Ambulatory Visit: Payer: BLUE CROSS/BLUE SHIELD | Admitting: Physical Therapy

## 2016-08-09 DIAGNOSIS — R262 Difficulty in walking, not elsewhere classified: Secondary | ICD-10-CM

## 2016-08-09 DIAGNOSIS — M25562 Pain in left knee: Secondary | ICD-10-CM

## 2016-08-09 DIAGNOSIS — M25662 Stiffness of left knee, not elsewhere classified: Secondary | ICD-10-CM

## 2016-08-09 DIAGNOSIS — M6281 Muscle weakness (generalized): Secondary | ICD-10-CM

## 2016-08-11 ENCOUNTER — Ambulatory Visit: Payer: BLUE CROSS/BLUE SHIELD | Admitting: Physical Therapy

## 2016-08-11 DIAGNOSIS — R262 Difficulty in walking, not elsewhere classified: Secondary | ICD-10-CM

## 2016-08-11 DIAGNOSIS — M6281 Muscle weakness (generalized): Secondary | ICD-10-CM

## 2016-08-11 DIAGNOSIS — M25662 Stiffness of left knee, not elsewhere classified: Secondary | ICD-10-CM

## 2016-08-11 DIAGNOSIS — M25562 Pain in left knee: Secondary | ICD-10-CM | POA: Diagnosis not present

## 2016-08-11 NOTE — Therapy (Signed)
[place], [place], [address] Phone: [phone]   Fax:  [phone]  Physical Therapy Treatment  Patient Details  Name: Margaret Phillips MRN: [id number] Date of Birth: 08/01/1961 Referring Provider: Joni Fears MD  Encounter Date: 08/09/2016      PT End of Session - 08/11/16 1421    Visit Number 5   Number of Visits 16   Date for PT Re-Evaluation 09/22/16   Authorization Type BCBS   Authorization Time Period 09-22-16   PT Start Time 0218     PT end time 1120 Past Medical History:  Diagnosis Date  . Factor V Leiden ([place])   . History of DVT of lower extremity   . History of nephrolithiasis   . History of pulmonary embolism    1987  &  1998  . Hyperlipidemia   . Mild obstructive sleep apnea    per pt study done 2006 (approx.)  mild OSA no cpap recommended  . PTSD (post-traumatic stress disorder)    NIGHT TERRORS  . Renal calculus, left     Past Surgical History:  Procedure Laterality Date  . CERVICAL FUSION  1997   C6 - 7  . CYSTOSCOPY W/ URETERAL STENT PLACEMENT Left 06/18/2015   Procedure: CYSTOSCOPY WITH STENT REPLACEMENT;  Surgeon: Cleon Gustin, MD;  Location: [facility];  Service: Urology;  Laterality: Left;  . CYSTOSCOPY WITH RETROGRADE PYELOGRAM, URETEROSCOPY AND STENT PLACEMENT Left 06/04/2015   Procedure: CYSTOSCOPY WITH RETROGRADE PYELOGRAM,  AND STENT PLACEMENT;  Surgeon: Cleon Gustin, MD;  Location: [facility];  Service: Urology;  Laterality: Left;  . CYSTOSCOPY WITH RETROGRADE PYELOGRAM, URETEROSCOPY AND STENT PLACEMENT Left 07/13/2015   Procedure: CYSTOSCOPY/RETROGRADE/STONE EXTRACTION WITH BASKET AND STENT EXCHANGE/PLACEMENT;  Surgeon: Cleon Gustin, MD;  Location: [facility];  Service: Urology;  Laterality: Left;  . CYSTOSCOPY WITH URETEROSCOPY Left 06/18/2015   Procedure: CYSTOSCOPY WITH URETEROSCOPY;  Surgeon: Cleon Gustin, MD;   Location: [facility];  Service: Urology;  Laterality: Left;  . CYSTOSCOPY/RETROGRADE/URETEROSCOPY/STONE EXTRACTION WITH BASKET Left 06/18/2015   Procedure: CYSTOSCOPY/RETROGRADE/STONE EXTRACTION WITH BASKET;  Surgeon: Cleon Gustin, MD;  Location: [facility];  Service: Urology;  Laterality: Left;  . HOLMIUM LASER APPLICATION Left [id number]   Procedure: HOLMIUM LASER APPLICATION;  Surgeon: Cleon Gustin, MD;  Location: [facility];  Service: Urology;  Laterality: Left;    There were no vitals filed for this visit.      Subjective Assessment - 08/11/16 1422    Subjective I felt good when I left however I had severe pain yesterday 7/10. I had to take pain meds,    Currently in Pain? Yes   Pain Score 5                          [facility] Adult PT Treatment/Exercise - 08/11/16 0001      Knee/Hip Exercises: Stretches   Passive Hamstring Stretch 3 reps;30 seconds   Passive Hamstring Stretch Limitations edge of mat, heel on floor      Knee/Hip Exercises: Aerobic   Nustep 5' L5     Knee/Hip Exercises: Seated   Long Arc Quad 2 sets;10 reps     Vasopneumatic   Number Minutes Vasopneumatic  15 minutes   Vasopnuematic Location  Knee   Vasopneumatic Pressure Medium   Vasopneumatic Temperature  32    Other exercises included clams,  SLR  5 X 4 sets SAQ with 2 pounds 10 x Heel slides X 10               PT Short Term Goals - 08/03/16 1549      PT SHORT TERM GOAL #1   Title "Independent with initial HEP   Baseline cues   Time 4   Period Weeks   Status On-going     PT SHORT TERM GOAL #2   Title "Report pain decrease at rest from   5/10 to   2/10.   Baseline 4/10   Time 4   Period Weeks   Status On-going     PT SHORT TERM GOAL #3   Title "Demonstrate and verbalize understanding of condition management including RICE, positioning, use of A.D., HEP to reduce pain and edema   Baseline understands RICE    Time 4   Period Weeks   Status On-going     PT SHORT TERM GOAL #4   Title Utilize walker without limping and using step through technique and heel toe contact   Baseline cane today with limp   Time 4   Period Weeks           PT Long Term Goals - 07/28/16 1310      PT LONG TERM GOAL #1   Title Pt will be independent with advanced home exercise program   Time 8   Period Weeks   Status New     PT LONG TERM GOAL #2   Title "Pain will decrease to 2/10 or less with all functional activities   Time 8   Period Weeks   Status New     PT LONG TERM GOAL #3   Title Left knee AAROM flexion will improve to 3-115 degrees for improved mobility to ride in car without increased pain   Time 8   Period Weeks   Status New     PT LONG TERM GOAL #4   Title Pt will be able to ambulate with LRAD at least 2.16ft/sec to indicate improved mobility   Time 8   Period Weeks   Status New     PT LONG TERM GOAL #5   Title "FOTO will improve from  74%limitation  to  52%limitation   indicating improved functional mobility .    Time 8   Period Weeks   Status New     Additional Long Term Goals   Additional Long Term Goals Yes     PT LONG TERM GOAL #6   Title Pt will be able to ascend/ descend steps with step over step technique and 2/10 or less pain in left knee   Time 8   Period Weeks   Status New               Plan - 08/11/16 1423    Clinical Impression Statement Slept well the night after last visit for the first time  in awhile. The next day had increased pain all day. Had to take meds. Better today. using cane today.    PT Next Visit Plan review level 1 knee exercise, gait training      Patient will benefit from skilled therapeutic intervention in order to improve the following deficits and impairments:     Visit Diagnosis: Acute pain of left knee  Stiffness of left knee, not elsewhere classified  Difficulty in walking, not elsewhere classified  Muscle weakness  (generalized)     Problem List Patient Active Problem List  Diagnosis Date Noted  . Renal hemorrhage, left 06/24/2015    Kassia Demarinis PTA 08/11/2016, 4:51 PM  [facility] [place], [place], [address] Phone: [phone]   Fax:  [phone]  Name: CHANIELLE MEURER MRN: [id number] Date of Birth: 03/23/1961

## 2016-08-12 NOTE — Therapy (Signed)
[place], [place], [address] Phone: [phone]   Fax:  [phone]  Physical Therapy Treatment  Patient Details  Name: Margaret Phillips MRN: [id number] Date of Birth: 1960/11/25 Referring Provider: Joni Fears MD  Encounter Date: 08/11/2016      PT End of Session - 08/11/16 1421    Visit Number 5   Number of Visits 16   Date for PT Re-Evaluation 09/22/16   Authorization Type BCBS   Authorization Time Period 09-22-16   PT Start Time 0218   PT Stop Time 0305   PT Time Calculation (min) 47 min      Past Medical History:  Diagnosis Date  . Factor V Leiden ([place])   . History of DVT of lower extremity   . History of nephrolithiasis   . History of pulmonary embolism    1987  &  1998  . Hyperlipidemia   . Mild obstructive sleep apnea    per pt study done 2006 (approx.)  mild OSA no cpap recommended  . PTSD (post-traumatic stress disorder)    NIGHT TERRORS  . Renal calculus, left     Past Surgical History:  Procedure Laterality Date  . CERVICAL FUSION  1997   C6 - 7  . CYSTOSCOPY W/ URETERAL STENT PLACEMENT Left 06/18/2015   Procedure: CYSTOSCOPY WITH STENT REPLACEMENT;  Surgeon: Cleon Gustin, MD;  Location: [facility];  Service: Urology;  Laterality: Left;  . CYSTOSCOPY WITH RETROGRADE PYELOGRAM, URETEROSCOPY AND STENT PLACEMENT Left 06/04/2015   Procedure: CYSTOSCOPY WITH RETROGRADE PYELOGRAM,  AND STENT PLACEMENT;  Surgeon: Cleon Gustin, MD;  Location: [facility];  Service: Urology;  Laterality: Left;  . CYSTOSCOPY WITH RETROGRADE PYELOGRAM, URETEROSCOPY AND STENT PLACEMENT Left 07/13/2015   Procedure: CYSTOSCOPY/RETROGRADE/STONE EXTRACTION WITH BASKET AND STENT EXCHANGE/PLACEMENT;  Surgeon: Cleon Gustin, MD;  Location: [facility];  Service: Urology;  Laterality: Left;  . CYSTOSCOPY WITH URETEROSCOPY Left 06/18/2015   Procedure: CYSTOSCOPY WITH URETEROSCOPY;   Surgeon: Cleon Gustin, MD;  Location: [facility];  Service: Urology;  Laterality: Left;  . CYSTOSCOPY/RETROGRADE/URETEROSCOPY/STONE EXTRACTION WITH BASKET Left 06/18/2015   Procedure: CYSTOSCOPY/RETROGRADE/STONE EXTRACTION WITH BASKET;  Surgeon: Cleon Gustin, MD;  Location: [facility];  Service: Urology;  Laterality: Left;  . HOLMIUM LASER APPLICATION Left [id number]   Procedure: HOLMIUM LASER APPLICATION;  Surgeon: Cleon Gustin, MD;  Location: [facility];  Service: Urology;  Laterality: Left;    There were no vitals filed for this visit.      Subjective Assessment - 08/11/16 1422    Subjective I felt good when I left however I had severe pain yesterday 7/10. I had to take pain meds,    Currently in Pain? Yes   Pain Score 5                          [facility] Adult PT Treatment/Exercise - 08/12/16 0001      Ambulation/Gait   Gait Comments in bars, weight shifting forawrd and back, retro gait to encourage quad contraction, practicing equal step length, heel strike     Knee/Hip Exercises: Stretches   Passive Hamstring Stretch 3 reps;30 seconds   Passive Hamstring Stretch Limitations edge of mat, heel on floor      Knee/Hip Exercises: Aerobic   Nustep 5' L5     Knee/Hip Exercises: Standing   Terminal Knee Extension Limitations ball on  wall x 10 increased pain     Knee/Hip Exercises: Seated   Long Arc Quad 2 sets;10 reps   Hamstring Curl 2 sets;10 reps   Hamstring Limitations yellow     Knee/Hip Exercises: Supine   Quad Sets 10 reps   Heel Slides 1 set;10 reps   Terminal Knee Extension Left;1 set;10 reps   Terminal Knee Extension Limitations increased pain     Vasopneumatic   Number Minutes Vasopneumatic  15 minutes   Vasopnuematic Location  Knee   Vasopneumatic Pressure Medium   Vasopneumatic Temperature  32                  PT Short Term Goals - 08/03/16 1549      PT SHORT TERM GOAL  #1   Title "Independent with initial HEP   Baseline cues   Time 4   Period Weeks   Status On-going     PT SHORT TERM GOAL #2   Title "Report pain decrease at rest from   5/10 to   2/10.   Baseline 4/10   Time 4   Period Weeks   Status On-going     PT SHORT TERM GOAL #3   Title "Demonstrate and verbalize understanding of condition management including RICE, positioning, use of A.D., HEP to reduce pain and edema   Baseline understands RICE   Time 4   Period Weeks   Status On-going     PT SHORT TERM GOAL #4   Title Utilize walker without limping and using step through technique and heel toe contact   Baseline cane today with limp   Time 4   Period Weeks           PT Long Term Goals - 07/28/16 1310      PT LONG TERM GOAL #1   Title Pt will be independent with advanced home exercise program   Time 8   Period Weeks   Status New     PT LONG TERM GOAL #2   Title "Pain will decrease to 2/10 or less with all functional activities   Time 8   Period Weeks   Status New     PT LONG TERM GOAL #3   Title Left knee AAROM flexion will improve to 3-115 degrees for improved mobility to ride in car without increased pain   Time 8   Period Weeks   Status New     PT LONG TERM GOAL #4   Title Pt will be able to ambulate with LRAD at least 2.88ft/sec to indicate improved mobility   Time 8   Period Weeks   Status New     PT LONG TERM GOAL #5   Title "FOTO will improve from  74%limitation  to  52%limitation   indicating improved functional mobility .    Time 8   Period Weeks   Status New     Additional Long Term Goals   Additional Long Term Goals Yes     PT LONG TERM GOAL #6   Title Pt will be able to ascend/ descend steps with step over step technique and 2/10 or less pain in left knee   Time 8   Period Weeks   Status New               Plan - 08/11/16 1423    Clinical Impression Statement Slept well the night after last visit for the first time  in awhile. The  next day had increased pain  all day. Had to take meds. Better today. using cane today. We worked in terminal knee extension and qaud activation closed chain and open chain. Terminal knee extension causes increased anterrior knee pain. Quad sets less painful after terminal knee extension exercises. More extension noted supine. Unable to find painfree exercise for terminal knee extension.    PT Next Visit Plan review level 1 knee exercise, gait training, extension   PT Home Exercise Plan cryotherapy, level 1 knee exericises,  Knee band , flexion      Patient will benefit from skilled therapeutic intervention in order to improve the following deficits and impairments:  Difficulty walking, Pain, Decreased mobility, Decreased strength, Decreased range of motion, Increased edema, Postural dysfunction, Increased muscle spasms, Obesity  Visit Diagnosis: Acute pain of left knee  Stiffness of left knee, not elsewhere classified  Difficulty in walking, not elsewhere classified  Muscle weakness (generalized)     Problem List Patient Active Problem List   Diagnosis Date Noted  . Renal hemorrhage, left 06/24/2015    Dorene Ar, PTA 08/12/2016, 8:18 AM  [place], [place], [address] Phone: [phone]   Fax:  [phone]  Name: FOX DALLY MRN: [id number] Date of Birth: 01-03-1961

## 2016-08-16 ENCOUNTER — Ambulatory Visit: Payer: BLUE CROSS/BLUE SHIELD | Admitting: Physical Therapy

## 2016-08-16 DIAGNOSIS — M6281 Muscle weakness (generalized): Secondary | ICD-10-CM

## 2016-08-16 DIAGNOSIS — M25562 Pain in left knee: Secondary | ICD-10-CM | POA: Diagnosis not present

## 2016-08-16 DIAGNOSIS — R262 Difficulty in walking, not elsewhere classified: Secondary | ICD-10-CM

## 2016-08-16 DIAGNOSIS — M25662 Stiffness of left knee, not elsewhere classified: Secondary | ICD-10-CM

## 2016-08-16 NOTE — Therapy (Signed)
[place], [place], [address] Phone: [phone]   Fax:  [phone]  Physical Therapy Treatment  Patient Details  Name: Margaret Phillips MRN: [id number] Date of Birth: 1961/07/28 Referring Provider: Joni Fears MD  Encounter Date: 08/16/2016      PT End of Session - 08/16/16 1229    Visit Number 6   Number of Visits 16   Date for PT Re-Evaluation 09/22/16   PT Start Time 1030   PT Stop Time 1115   PT Time Calculation (min) 45 min   Activity Tolerance Patient tolerated treatment well   Behavior During Therapy [facility] for tasks assessed/performed      Past Medical History:  Diagnosis Date  . Factor V Leiden ([place])   . History of DVT of lower extremity   . History of nephrolithiasis   . History of pulmonary embolism    1987  &  1998  . Hyperlipidemia   . Mild obstructive sleep apnea    per pt study done 2006 (approx.)  mild OSA no cpap recommended  . PTSD (post-traumatic stress disorder)    NIGHT TERRORS  . Renal calculus, left     Past Surgical History:  Procedure Laterality Date  . CERVICAL FUSION  1997   C6 - 7  . CYSTOSCOPY W/ URETERAL STENT PLACEMENT Left 06/18/2015   Procedure: CYSTOSCOPY WITH STENT REPLACEMENT;  Surgeon: Cleon Gustin, MD;  Location: [facility];  Service: Urology;  Laterality: Left;  . CYSTOSCOPY WITH RETROGRADE PYELOGRAM, URETEROSCOPY AND STENT PLACEMENT Left 06/04/2015   Procedure: CYSTOSCOPY WITH RETROGRADE PYELOGRAM,  AND STENT PLACEMENT;  Surgeon: Cleon Gustin, MD;  Location: [facility];  Service: Urology;  Laterality: Left;  . CYSTOSCOPY WITH RETROGRADE PYELOGRAM, URETEROSCOPY AND STENT PLACEMENT Left 07/13/2015   Procedure: CYSTOSCOPY/RETROGRADE/STONE EXTRACTION WITH BASKET AND STENT EXCHANGE/PLACEMENT;  Surgeon: Cleon Gustin, MD;  Location: [facility];  Service: Urology;  Laterality: Left;  . CYSTOSCOPY WITH URETEROSCOPY Left  06/18/2015   Procedure: CYSTOSCOPY WITH URETEROSCOPY;  Surgeon: Cleon Gustin, MD;  Location: [facility];  Service: Urology;  Laterality: Left;  . CYSTOSCOPY/RETROGRADE/URETEROSCOPY/STONE EXTRACTION WITH BASKET Left 06/18/2015   Procedure: CYSTOSCOPY/RETROGRADE/STONE EXTRACTION WITH BASKET;  Surgeon: Cleon Gustin, MD;  Location: [facility];  Service: Urology;  Laterality: Left;  . HOLMIUM LASER APPLICATION Left [id number]   Procedure: HOLMIUM LASER APPLICATION;  Surgeon: Cleon Gustin, MD;  Location: [facility];  Service: Urology;  Laterality: Left;    There were no vitals filed for this visit.      Subjective Assessment - 08/16/16 1032    Subjective I don't really have pain. Sleeping better now.  I am doing the exercises.  I still have a limp   Currently in Pain? Yes   Pain Score 2    Pain Location Knee   Pain Orientation Left   Pain Descriptors / Indicators Aching   Pain Frequency Intermittent   Aggravating Factors  standing   Pain Relieving Factors ice, tylenol, rest            [facility] PT Assessment - 08/16/16 0001      AROM   Left Knee Flexion 118                     [facility] Adult PT Treatment/Exercise - 08/16/16 0001      Ambulation/Gait   Gait Comments parallel bars,  weightshifting,  Cues for  fip flexion vs using hip to hike hip/leg,  small marches and tactile with shoulders improved gait.      Knee/Hip Exercises: Standing   Terminal Knee Extension Limitations ball on wall x 10 increased pain  4/10   Other Standing Knee Exercises facing  wall, weight shifting to left 10 X 2 sets,  cues needed.     Knee/Hip Exercises: Seated   Long Arc Quad 2 sets;10 reps   Hamstring Curl 2 sets;10 reps   Hamstring Limitations yellow     Knee/Hip Exercises: Supine   Quad Sets 1 set;10 reps  small roll under.  Painful with flat = 6/10   Short Arc Quad Sets 1 set;10 reps   Heel Slides 1 set;10 reps      Vasopneumatic   Number Minutes Vasopneumatic  15 minutes   Vasopnuematic Location  Knee   Vasopneumatic Pressure Medium   Vasopneumatic Temperature  32  leg elevated                  PT Short Term Goals - 08/16/16 1234      PT SHORT TERM GOAL #1   Title "Independent with initial HEP   Time 4   Period Weeks   Status On-going     PT SHORT TERM GOAL #2   Title "Report pain decrease at rest from   5/10 to   2/10.   Baseline Pain can be intermittant,  not yet consistant 2/10 at rest.   Time 4   Period Weeks   Status On-going     PT SHORT TERM GOAL #3   Title "Demonstrate and verbalize understanding of condition management including RICE, positioning, use of A.D., HEP to reduce pain and edema   Time 4   Period Weeks   Status Unable to assess     PT SHORT TERM GOAL #4   Title Utilize walker without limping and using step through technique and heel toe contact   Baseline able,  No longer needs walker.   Time 4   Period Weeks   Status On-going           PT Long Term Goals - 07/28/16 1310      PT LONG TERM GOAL #1   Title Pt will be independent with advanced home exercise program   Time 8   Period Weeks   Status New     PT LONG TERM GOAL #2   Title "Pain will decrease to 2/10 or less with all functional activities   Time 8   Period Weeks   Status New     PT LONG TERM GOAL #3   Title Left knee AAROM flexion will improve to 3-115 degrees for improved mobility to ride in car without increased pain   Time 8   Period Weeks   Status New     PT LONG TERM GOAL #4   Title Pt will be able to ambulate with LRAD at least 2.79ft/sec to indicate improved mobility   Time 8   Period Weeks   Status New     PT LONG TERM GOAL #5   Title "FOTO will improve from  74%limitation  to  52%limitation   indicating improved functional mobility .    Time 8   Period Weeks   Status New     Additional Long Term Goals   Additional Long Term Goals Yes     PT LONG TERM GOAL  #6   Title Pt will be able to ascend/  descend steps with step over step technique and 2/10 or less pain in left knee   Time 8   Period Weeks   Status New               Plan - 08/16/16 1230    Clinical Impression Statement Short session due to patient was 15 minutes late due to traffic.  ROM helpful.  Pain 6/10 pain with some exercises with less pain at end of session. (5/10) AROM 118 flexion.   PT Next Visit Plan progress exercisesto level 2   PT Home Exercise Plan cryotherapy, level 1 knee exericises,  Knee band , flexion   Consulted and Agree with Plan of Care Patient      Patient will benefit from skilled therapeutic intervention in order to improve the following deficits and impairments:  Difficulty walking, Pain, Decreased mobility, Decreased strength, Decreased range of motion, Increased edema, Postural dysfunction, Increased muscle spasms, Obesity  Visit Diagnosis: Acute pain of left knee  Stiffness of left knee, not elsewhere classified  Difficulty in walking, not elsewhere classified  Muscle weakness (generalized)     Problem List Patient Active Problem List   Diagnosis Date Noted  . Renal hemorrhage, left 06/24/2015    Raneem Mendolia PTA 08/16/2016, 12:36 PM  [place], [place], [address] Phone: [phone]   Fax:  [phone]  Name: Margaret Phillips MRN: [id number] Date of Birth: [birth year]

## 2016-08-18 ENCOUNTER — Ambulatory Visit: Payer: BLUE CROSS/BLUE SHIELD | Attending: Orthopaedic Surgery | Admitting: Physical Therapy

## 2016-08-18 DIAGNOSIS — R262 Difficulty in walking, not elsewhere classified: Secondary | ICD-10-CM | POA: Diagnosis present

## 2016-08-18 DIAGNOSIS — M6281 Muscle weakness (generalized): Secondary | ICD-10-CM

## 2016-08-18 DIAGNOSIS — M25662 Stiffness of left knee, not elsewhere classified: Secondary | ICD-10-CM | POA: Insufficient documentation

## 2016-08-18 DIAGNOSIS — M25562 Pain in left knee: Secondary | ICD-10-CM

## 2016-08-19 ENCOUNTER — Emergency Department (HOSPITAL_COMMUNITY)
Admission: EM | Admit: 2016-08-19 | Discharge: 2016-08-19 | Disposition: A | Discharge status: Home / Self Care | Payer: BLUE CROSS/BLUE SHIELD | Attending: Emergency Medicine | Admitting: Emergency Medicine

## 2016-08-19 ENCOUNTER — Emergency Department (HOSPITAL_COMMUNITY): Payer: BLUE CROSS/BLUE SHIELD

## 2016-08-19 ENCOUNTER — Encounter (HOSPITAL_COMMUNITY): Payer: Self-pay | Admitting: Emergency Medicine

## 2016-08-19 DIAGNOSIS — S0990XA Unspecified injury of head, initial encounter: Secondary | ICD-10-CM | POA: Diagnosis present

## 2016-08-19 DIAGNOSIS — S01111A Laceration without foreign body of right eyelid and periocular area, initial encounter: Secondary | ICD-10-CM | POA: Insufficient documentation

## 2016-08-19 DIAGNOSIS — Y999 Unspecified external cause status: Secondary | ICD-10-CM | POA: Diagnosis not present

## 2016-08-19 DIAGNOSIS — W01198A Fall on same level from slipping, tripping and stumbling with subsequent striking against other object, initial encounter: Secondary | ICD-10-CM | POA: Insufficient documentation

## 2016-08-19 DIAGNOSIS — Y939 Activity, unspecified: Secondary | ICD-10-CM | POA: Diagnosis not present

## 2016-08-19 DIAGNOSIS — Z91040 Latex allergy status: Secondary | ICD-10-CM | POA: Insufficient documentation

## 2016-08-19 DIAGNOSIS — Z7901 Long term (current) use of anticoagulants: Secondary | ICD-10-CM | POA: Insufficient documentation

## 2016-08-19 DIAGNOSIS — Y929 Unspecified place or not applicable: Secondary | ICD-10-CM | POA: Diagnosis not present

## 2016-08-19 DIAGNOSIS — S0181XA Laceration without foreign body of other part of head, initial encounter: Secondary | ICD-10-CM

## 2016-08-19 LAB — CBC
HEMATOCRIT: 39.2 % (ref 36.0–46.0)
Hemoglobin: 12.6 g/dL (ref 12.0–15.0)
MCH: 29.4 pg (ref 26.0–34.0)
MCHC: 32.1 g/dL (ref 30.0–36.0)
MCV: 91.4 fL (ref 78.0–100.0)
Platelets: 184 10*3/uL (ref 150–400)
RBC: 4.29 MIL/uL (ref 3.87–5.11)
RDW: 13.6 % (ref 11.5–15.5)
WBC: 6.1 10*3/uL (ref 4.0–10.5)

## 2016-08-19 LAB — PROTIME-INR
INR: 1.91
PROTHROMBIN TIME: 22.2 s — AB (ref 11.4–15.2)

## 2016-08-19 MED ORDER — HYDROCODONE-ACETAMINOPHEN 5-325 MG PO TABS
2.0000 | ORAL_TABLET | Freq: Once | ORAL | Status: AC
  Administered 2016-08-19: 2 via ORAL
  Filled 2016-08-19: qty 2

## 2016-08-19 MED ORDER — HYDROCODONE-ACETAMINOPHEN 5-325 MG PO TABS
1.0000 | ORAL_TABLET | Freq: Once | ORAL | Status: AC
  Administered 2016-08-19: 1 via ORAL
  Filled 2016-08-19: qty 1

## 2016-08-19 MED ORDER — HYDROCODONE-ACETAMINOPHEN 5-325 MG PO TABS: 2.0000 | ORAL_TABLET | ORAL | 0 refills | Status: DC | PRN

## 2016-08-19 MED ORDER — LIDOCAINE HCL (PF) 1 % IJ SOLN
30.0000 mL | Freq: Once | INTRAMUSCULAR | Status: AC
  Administered 2016-08-19: 30 mL via INTRADERMAL
  Filled 2016-08-19: qty 30

## 2016-08-19 NOTE — Discharge Instructions (Signed)
Watch for signs of infection or intracranial bleeding (vomiting, confusion, other) Stitches removed in 5 to 7 days.   If you were given medicines take as directed.  If you are on coumadin or contraceptives realize their levels and effectiveness is altered by many different medicines.  If you have any reaction (rash, tongues swelling, other) to the medicines stop taking and see a physician.    If your blood pressure was elevated in the ER make sure you follow up for management with a primary doctor or return for chest pain, shortness of breath or stroke symptoms.  Please follow up as directed and return to the ER or see a physician for new or worsening symptoms.  Thank you. Vitals:   08/19/16 1451  BP: 156/80  Pulse: 81  Resp: 14  Temp: 98.5 F (36.9 C)  TempSrc: Oral  SpO2: 96%  Height: 5\' 5"  (1.651 m)

## 2016-08-19 NOTE — ED Provider Notes (Signed)
[facility] DEPT Provider Note   CSN: [id number] Arrival date & time: 08/19/16  1439     History   Chief Complaint Chief Complaint  Patient presents with  . Fall    HPI TKIYAH REICHMAN is a 55 y.o. female.  Patient with history of blood clots on Coumadin for greater than 20 years, factor V Leiden presents after mechanical fall when she cut her foot on the step while shopping leading her to hit her right for head on the brick ledge. Patient had no syncope, no other symptoms prior to the event. Patient recalls all details. Patient's had no vomiting confusion or neurologic symptoms since. This was witnessed. Patient has mild right anterior knee pain. No other injuries      Past Medical History:  Diagnosis Date  . Factor V Leiden ([place])   . History of DVT of lower extremity   . History of nephrolithiasis   . History of pulmonary embolism    1987  &  1998  . Hyperlipidemia   . Mild obstructive sleep apnea    per pt study done 2006 (approx.)  mild OSA no cpap recommended  . PTSD (post-traumatic stress disorder)    NIGHT TERRORS  . Renal calculus, left     Patient Active Problem List   Diagnosis Date Noted  . Renal hemorrhage, left 06/24/2015    Past Surgical History:  Procedure Laterality Date  . CERVICAL FUSION  1997   C6 - 7  . CYSTOSCOPY W/ URETERAL STENT PLACEMENT Left 06/18/2015   Procedure: CYSTOSCOPY WITH STENT REPLACEMENT;  Surgeon: Cleon Gustin, MD;  Location: [facility];  Service: Urology;  Laterality: Left;  . CYSTOSCOPY WITH RETROGRADE PYELOGRAM, URETEROSCOPY AND STENT PLACEMENT Left 06/04/2015   Procedure: CYSTOSCOPY WITH RETROGRADE PYELOGRAM,  AND STENT PLACEMENT;  Surgeon: Cleon Gustin, MD;  Location: [facility];  Service: Urology;  Laterality: Left;  . CYSTOSCOPY WITH RETROGRADE PYELOGRAM, URETEROSCOPY AND STENT PLACEMENT Left 07/13/2015   Procedure: CYSTOSCOPY/RETROGRADE/STONE EXTRACTION WITH BASKET AND STENT EXCHANGE/PLACEMENT;   Surgeon: Cleon Gustin, MD;  Location: [facility];  Service: Urology;  Laterality: Left;  . CYSTOSCOPY WITH URETEROSCOPY Left 06/18/2015   Procedure: CYSTOSCOPY WITH URETEROSCOPY;  Surgeon: Cleon Gustin, MD;  Location: [facility];  Service: Urology;  Laterality: Left;  . CYSTOSCOPY/RETROGRADE/URETEROSCOPY/STONE EXTRACTION WITH BASKET Left 06/18/2015   Procedure: CYSTOSCOPY/RETROGRADE/STONE EXTRACTION WITH BASKET;  Surgeon: Cleon Gustin, MD;  Location: [facility];  Service: Urology;  Laterality: Left;  . HOLMIUM LASER APPLICATION Left [id number]   Procedure: HOLMIUM LASER APPLICATION;  Surgeon: Cleon Gustin, MD;  Location: [facility];  Service: Urology;  Laterality: Left;    OB History    No data available       Home Medications    Prior to Admission medications   Medication Sig Start Date End Date Taking? Authorizing Provider  acetaminophen (TYLENOL) 500 MG tablet Take 1,000 mg by mouth every 6 (six) hours as needed for fever.   Yes Historical Provider, MD  atorvastatin (LIPITOR) 10 MG tablet Take 10 mg by mouth at bedtime. 05/18/15  Yes Historical Provider, MD  clonazePAM (KLONOPIN) 0.5 MG tablet Take 0.5 mg by mouth 2 (two) times daily as needed for anxiety.   Yes Historical Provider, MD  oxybutynin (DITROPAN-XL) 10 MG 24 hr tablet TK 1 T PO QD 07/22/16  Yes Historical Provider, MD  oxyCODONE-acetaminophen (PERCOCET/ROXICET) 5-325 MG tablet Take 1 tablet by mouth every 4 (  four) hours as needed for severe pain.   Yes Historical Provider, MD  prazosin (MINIPRESS) 2 MG capsule Take 2 mg by mouth at bedtime.  05/18/15  Yes Historical Provider, MD  sertraline (ZOLOFT) 100 MG tablet Take 150 mg by mouth at bedtime.    Yes Historical Provider, MD  warfarin (COUMADIN) 5 MG tablet Take 10-15 mg by mouth daily. TAKES 15MG  ON MON, WED TAKES 10MG  ALL OTHER DAYS   Yes Historical Provider, MD  HYDROcodone-acetaminophen  (NORCO) 5-325 MG tablet Take 2 tablets by mouth every 4 (four) hours as needed. 08/19/16   Elnora Morrison, MD    Family History History reviewed. No pertinent family history.  Social History Social History  Substance Use Topics  . Smoking status: Never Smoker  . Smokeless tobacco: Never Used  . Alcohol use 1.8 oz/week    3 Glasses of wine per week     Comment: weekends only     Allergies   Betadine [povidone iodine]; Contrast media [iodinated diagnostic agents]; Iodine; and Latex   Review of Systems Review of Systems  Constitutional: Negative for chills and fever.  HENT: Negative for congestion.   Eyes: Negative for visual disturbance.  Respiratory: Negative for shortness of breath.   Cardiovascular: Negative for chest pain.  Gastrointestinal: Negative for abdominal pain and vomiting.  Genitourinary: Negative for dysuria and flank pain.  Musculoskeletal: Negative for back pain, neck pain and neck stiffness.  Skin: Positive for wound. Negative for rash.  Neurological: Positive for headaches. Negative for light-headedness.     Physical Exam Updated Vital Signs BP 156/80 (BP Location: Right Arm)   Pulse 81   Temp 98.5 F (36.9 C) (Oral)   Resp 14   Ht 5\' 5"  (1.651 m)   SpO2 96%   Physical Exam  Constitutional: She is oriented to person, place, and time. She appears well-developed and well-nourished.  HENT:  Head: Normocephalic.  Patient has a 3 cm laceration mild gaping above right eyebrow. Patient can raise both eyebrows and wrinkle both sides were for head however decrease in the right likely secondary to pain. Patient's neck is supple no midline tenderness. Patient has no trismus. No mandibular or nasal tenderness. Bleeding mild controlled.  Eyes: Conjunctivae are normal. Right eye exhibits no discharge. Left eye exhibits no discharge.  Neck: Normal range of motion. Neck supple. No tracheal deviation present.  Cardiovascular: Normal rate and regular rhythm.     Pulmonary/Chest: Effort normal and breath sounds normal.  Abdominal: Soft. She exhibits no distension. There is no tenderness. There is no guarding.  Musculoskeletal: She exhibits no edema.  No midline cervical thoracic or lumbar tenderness full range of motion head neck. Patient has no hip tenderness bilateral, no tenderness to bilateral ankles.  Patient has mild tenderness left knee however this is chronic since her surgery. Patient has moderate tenderness to right knee with flexion in bilateral joint line palpation. No deformity. Soft compartments lower extent is bilateral. No wrist tenderness bilateral  Neurological: She is alert and oriented to person, place, and time.  5+ strength in UE and LE with f/e at major joints. Sensation to palpation intact in UE and LE. CNs 2-12 grossly intact.  EOMFI.  PERRL.   Visual fields intact to finger testing. No nystagmus   Skin: Skin is warm. No rash noted.  Psychiatric: She has a normal mood and affect.  Nursing note and vitals reviewed.    ED Treatments / Results  Labs (all labs ordered are listed, but only  abnormal results are displayed) Labs Reviewed  PROTIME-INR - Abnormal; Notable for the following:       Result Value   Prothrombin Time 22.2 (*)    All other components within normal limits  CBC    EKG  EKG Interpretation None       Radiology Ct Head Wo Contrast  Result Date: 08/19/2016 CLINICAL DATA:  Pain after fall EXAM: CT HEAD WITHOUT CONTRAST CT MAXILLOFACIAL WITHOUT CONTRAST TECHNIQUE: Multidetector CT imaging of the head and maxillofacial structures were performed using the standard protocol without intravenous contrast. Multiplanar CT image reconstructions of the maxillofacial structures were also generated. COMPARISON:  None. FINDINGS: CT HEAD FINDINGS Brain: No evidence of acute infarction, hemorrhage, hydrocephalus, extra-axial collection or mass lesion/mass effect. Vascular: No hyperdense vessel or unexpected  calcification. Skull: Normal. Negative for fracture or focal lesion. Other: Soft tissue swelling is seen in the right periorbital region and right forehead with no underlying fracture. CT MAXILLOFACIAL FINDINGS Osseous: No fracture or mandibular dislocation. No destructive process. Orbits: Negative. No traumatic or inflammatory finding. Sinuses: Mucosal thickening is seen in the inferior maxillary sinuses. Soft tissues: Soft tissue swelling in the right periorbital region and forehead. The underlying globe is intact. No other extracranial soft tissue abnormalities. IMPRESSION: 1. No acute intracranial process. 2. Soft tissue swelling in the right periorbital region and right forehead. The right orbital contents are intact and no fractures are identified. 3. No facial bone fractures identified. Electronically Signed   By: Dorise Bullion III M.D   On: 08/19/2016 16:29   Dg Knee Complete 4 Views Right  Result Date: 08/19/2016 CLINICAL DATA:  Injury EXAM: RIGHT KNEE - COMPLETE 4+ VIEW COMPARISON:  None. FINDINGS: No acute fracture. No dislocation.  Unremarkable soft tissues. IMPRESSION: No acute bony pathology Electronically Signed   By: Marybelle Killings M.D.   On: 08/19/2016 16:14   Ct Maxillofacial Wo Contrast  Result Date: 08/19/2016 CLINICAL DATA:  Pain after fall EXAM: CT HEAD WITHOUT CONTRAST CT MAXILLOFACIAL WITHOUT CONTRAST TECHNIQUE: Multidetector CT imaging of the head and maxillofacial structures were performed using the standard protocol without intravenous contrast. Multiplanar CT image reconstructions of the maxillofacial structures were also generated. COMPARISON:  None. FINDINGS: CT HEAD FINDINGS Brain: No evidence of acute infarction, hemorrhage, hydrocephalus, extra-axial collection or mass lesion/mass effect. Vascular: No hyperdense vessel or unexpected calcification. Skull: Normal. Negative for fracture or focal lesion. Other: Soft tissue swelling is seen in the right periorbital region and  right forehead with no underlying fracture. CT MAXILLOFACIAL FINDINGS Osseous: No fracture or mandibular dislocation. No destructive process. Orbits: Negative. No traumatic or inflammatory finding. Sinuses: Mucosal thickening is seen in the inferior maxillary sinuses. Soft tissues: Soft tissue swelling in the right periorbital region and forehead. The underlying globe is intact. No other extracranial soft tissue abnormalities. IMPRESSION: 1. No acute intracranial process. 2. Soft tissue swelling in the right periorbital region and right forehead. The right orbital contents are intact and no fractures are identified. 3. No facial bone fractures identified. Electronically Signed   By: Dorise Bullion III M.D   On: 08/19/2016 16:29    Procedures .Marland KitchenLaceration Repair Date/Time: 08/19/2016 4:00 PM Performed by: Elnora Morrison Authorized by: Elnora Morrison   Consent:    Consent obtained:  Verbal   Consent given by:  Patient   Risks discussed:  Pain and infection Anesthesia (see MAR for exact dosages):    Anesthesia method:  Local infiltration   Local anesthetic:  Lidocaine 1% w/o epi Laceration details:  Location:  Face   Length (cm):  3   Depth (mm):  5 Pre-procedure details:    Preparation:  Patient was prepped and draped in usual sterile fashion Exploration:    Wound exploration: entire depth of wound probed and visualized     Contaminated: no   Treatment:    Area cleansed with:  Saline   Amount of cleaning:  Standard   Irrigation solution:  Sterile saline   Irrigation volume:  30   Irrigation method:  Syringe   Visualized foreign bodies/material removed: no   Skin repair:    Repair method:  Sutures   Suture size:  5-0   Number of sutures:  5 Approximation:    Approximation:  Close   (including critical care time)  Medications Ordered in ED Medications  lidocaine (PF) (XYLOCAINE) 1 % injection 30 mL (30 mLs Intradermal Given 08/19/16 1549)  HYDROcodone-acetaminophen  (NORCO/VICODIN) 5-325 MG per tablet 2 tablet (2 tablets Oral Given 08/19/16 1549)  HYDROcodone-acetaminophen (NORCO/VICODIN) 5-325 MG per tablet 1 tablet (1 tablet Oral Given 08/19/16 1731)     Initial Impression / Assessment and Plan / ED Course  I have reviewed the triage vital signs and the nursing notes.  Pertinent labs & imaging results that were available during my care of the patient were reviewed by me and considered in my medical decision making (see chart for details).  Clinical Course   Patient presents after mechanical fall however patient is on Coumadin chronically. Plan to check INR, patient has normal neurologic exam discussed screening CT scan and reasons to return if patient has signs of delayed bleed. Laceration repaired in the ER. X-ray obtained to the right knee and discussed outpatient follow-up.  Results and differential diagnosis were discussed with the patient/parent/guardian. Xrays were independently reviewed by myself.  Close follow up outpatient was discussed, comfortable with the plan.   Medications  lidocaine (PF) (XYLOCAINE) 1 % injection 30 mL (30 mLs Intradermal Given 08/19/16 1549)  HYDROcodone-acetaminophen (NORCO/VICODIN) 5-325 MG per tablet 2 tablet (2 tablets Oral Given 08/19/16 1549)  HYDROcodone-acetaminophen (NORCO/VICODIN) 5-325 MG per tablet 1 tablet (1 tablet Oral Given 08/19/16 1731)    Vitals:   08/19/16 1451  BP: 156/80  Pulse: 81  Resp: 14  Temp: 98.5 F (36.9 C)  TempSrc: Oral  SpO2: 96%  Height: 5\' 5"  (1.651 m)    Final diagnoses:  Acute head injury, initial encounter  Facial laceration, initial encounter     Final Clinical Impressions(s) / ED Diagnoses   Final diagnoses:  Acute head injury, initial encounter  Facial laceration, initial encounter    New Prescriptions New Prescriptions   HYDROCODONE-ACETAMINOPHEN (NORCO) 5-325 MG TABLET    Take 2 tablets by mouth every 4 (four) hours as needed.     Elnora Morrison,  MD 08/19/16 [phone]

## 2016-08-19 NOTE — ED Notes (Signed)
Pt wheeled to vehicle, family driving patient home. NAD. Ambulatory with steady gait. Verbalizes understanding of discharge instructions.

## 2016-08-19 NOTE — ED Triage Notes (Signed)
Last tetanus shot was last year.

## 2016-08-19 NOTE — ED Triage Notes (Signed)
Pt to ER BIB [facility] for evaluation of head laceration after a fall. Pt reports tripping and falling forward hitting head on a brick wall. No LOC reported. No neck or back pain. Reports pain to right knee and right forehead. VSS. Pt is on coumadin for factor V clotting disorder.

## 2016-08-23 ENCOUNTER — Ambulatory Visit: Payer: BLUE CROSS/BLUE SHIELD | Admitting: Physical Therapy

## 2016-08-23 DIAGNOSIS — M6281 Muscle weakness (generalized): Secondary | ICD-10-CM

## 2016-08-23 DIAGNOSIS — M25562 Pain in left knee: Secondary | ICD-10-CM

## 2016-08-23 DIAGNOSIS — R262 Difficulty in walking, not elsewhere classified: Secondary | ICD-10-CM

## 2016-08-23 DIAGNOSIS — M25662 Stiffness of left knee, not elsewhere classified: Secondary | ICD-10-CM

## 2016-08-23 NOTE — Therapy (Signed)
[place], [place], [address] Phone: [phone]   Fax:  [phone]  Physical Therapy Treatment  Patient Details  Name: Margaret Phillips MRN: [id number] Date of Birth: 1961-03-27 Referring Provider: Joni Fears MD  Encounter Date: 08/23/2016      PT End of Session - 08/23/16 1230    Visit Number 7   Number of Visits 16   Date for PT Re-Evaluation 09/22/16   PT Start Time 3300   PT Stop Time 1230   PT Time Calculation (min) 37 min   Activity Tolerance Other (comment)  not feeling well due to recent head injury   Behavior During Therapy [facility] for tasks assessed/performed      Past Medical History:  Diagnosis Date  . Factor V Leiden ([place])   . History of DVT of lower extremity   . History of nephrolithiasis   . History of pulmonary embolism    1987  &  1998  . Hyperlipidemia   . Mild obstructive sleep apnea    per pt study done 2006 (approx.)  mild OSA no cpap recommended  . PTSD (post-traumatic stress disorder)    NIGHT TERRORS  . Renal calculus, left     Past Surgical History:  Procedure Laterality Date  . CERVICAL FUSION  1997   C6 - 7  . CYSTOSCOPY W/ URETERAL STENT PLACEMENT Left 06/18/2015   Procedure: CYSTOSCOPY WITH STENT REPLACEMENT;  Surgeon: Cleon Gustin, MD;  Location: [facility];  Service: Urology;  Laterality: Left;  . CYSTOSCOPY WITH RETROGRADE PYELOGRAM, URETEROSCOPY AND STENT PLACEMENT Left 06/04/2015   Procedure: CYSTOSCOPY WITH RETROGRADE PYELOGRAM,  AND STENT PLACEMENT;  Surgeon: Cleon Gustin, MD;  Location: [facility];  Service: Urology;  Laterality: Left;  . CYSTOSCOPY WITH RETROGRADE PYELOGRAM, URETEROSCOPY AND STENT PLACEMENT Left 07/13/2015   Procedure: CYSTOSCOPY/RETROGRADE/STONE EXTRACTION WITH BASKET AND STENT EXCHANGE/PLACEMENT;  Surgeon: Cleon Gustin, MD;  Location: [facility];  Service: Urology;  Laterality: Left;  .  CYSTOSCOPY WITH URETEROSCOPY Left 06/18/2015   Procedure: CYSTOSCOPY WITH URETEROSCOPY;  Surgeon: Cleon Gustin, MD;  Location: [facility];  Service: Urology;  Laterality: Left;  . CYSTOSCOPY/RETROGRADE/URETEROSCOPY/STONE EXTRACTION WITH BASKET Left 06/18/2015   Procedure: CYSTOSCOPY/RETROGRADE/STONE EXTRACTION WITH BASKET;  Surgeon: Cleon Gustin, MD;  Location: [facility];  Service: Urology;  Laterality: Left;  . HOLMIUM LASER APPLICATION Left 04/21/2262   Procedure: HOLMIUM LASER APPLICATION;  Surgeon: Cleon Gustin, MD;  Location: [facility];  Service: Urology;  Laterality: Left;    There were no vitals filed for this visit.      Subjective Assessment - 08/23/16 1156    Subjective  I FELL AGAIN ON fRIDAY.  walking up a ramp and one foot cought on the curb .  I have 6 stiches.  I really don't feel well, I am afraid if I work hard my head will hurt.  Pain Right wrist 3/10, head 5/10, right knee 3/10 (bruise) Left knee 2/10.  II had X ray anf CT scan of my head due to being on blood thinners.  I did not hurt my knee.    Currently in Pain? Yes   Pain Score 2    Pain Location Knee   Pain Orientation Left   Pain Descriptors / Indicators Sore  stiff mainly   Pain Type Acute pain   Pain Frequency Constant   Aggravating Factors  falling   Pain Relieving Factors  rest  elevation   Multiple Pain Sites --  See other pain from fall above            [facility] PT Assessment - 08/23/16 0001      PROM   Left Knee Flexion 118  Patient using hands.  No pain .  Stiff                     [facility] Adult PT Treatment/Exercise - 08/23/16 0001      Self-Care   Self-Care --  Use a cane for safety.     Knee/Hip Exercises: Stretches   Passive Hamstring Stretch 3 reps;30 seconds     Knee/Hip Exercises: Aerobic   Nustep 6 minutes L1 1.3 miles     Knee/Hip Exercises: Supine   Short Arc Quad Sets 2 sets;10 reps  2 LBS   Other  Supine Knee/Hip Exercises Manually resisted curls hamstring.      Cryotherapy   Number Minutes Cryotherapy 15 Minutes   Cryotherapy Location Knee   Type of Cryotherapy --  cold pack with leg elevated     Manual Therapy   Manual therapy comments retrograde soft tissue work,  PROM to decrease stiffness.                   PT Short Term Goals - 08/23/16 1235      PT SHORT TERM GOAL #1   Title "Independent with initial HEP   Baseline has not been consistant lately due to falls.  Understands what to do.   Time 4   Period Weeks   Status On-going     PT SHORT TERM GOAL #2   Title "Report pain decrease at rest from   5/10 to   2/10.   Baseline 2/10 at rest   Time 4   Period Weeks   Status Achieved     PT SHORT TERM GOAL #3   Title "Demonstrate and verbalize understanding of condition management including RICE, positioning, use of A.D., HEP to reduce pain and edema   Baseline understands use of RICE, Needs cues for use of AD.   Time 4   Period Weeks   Status Partially Met     PT SHORT TERM GOAL #4   Title Utilize walker without limping and using step through technique and heel toe contact   Baseline able,  No longer needs walker.   Time 4   Period Weeks   Status Achieved           PT Long Term Goals - 07/28/16 1310      PT LONG TERM GOAL #1   Title Pt will be independent with advanced home exercise program   Time 8   Period Weeks   Status New     PT LONG TERM GOAL #2   Title "Pain will decrease to 2/10 or less with all functional activities   Time 8   Period Weeks   Status New     PT LONG TERM GOAL #3   Title Left knee AAROM flexion will improve to 3-115 degrees for improved mobility to ride in car without increased pain   Time 8   Period Weeks   Status New     PT LONG TERM GOAL #4   Title Pt will be able to ambulate with LRAD at least 2.48f/sec to indicate improved mobility   Time 8   Period Weeks   Status New     PT LONG TERM GOAL #  5    Title "FOTO will improve from  74%limitation  to  52%limitation   indicating improved functional mobility .    Time 8   Period Weeks   Status New     Additional Long Term Goals   Additional Long Term Goals Yes     PT LONG TERM GOAL #6   Title Pt will be able to ascend/ descend steps with step over step technique and 2/10 or less pain in left knee   Time 8   Period Weeks   Status New               Plan - 08/23/16 1232    Clinical Impression Statement Patient has has 2 falls within the last week with 1 trip to emergency room with a head injury. She is encouraged to use a gain for gait.  PROM 118 flexion.  Short session today due to patient not feeling well and arriving a little late. No new goals met./ assessed.    PT Next Visit Plan FOTO soon. See iof she can progress exercises.  Check to see if she is using a cane.  balance work.   PT Home Exercise Plan cryotherapy, level 1 knee exericises,  Knee band , flexion   Consulted and Agree with Plan of Care Patient      Patient will benefit from skilled therapeutic intervention in order to improve the following deficits and impairments:  Difficulty walking, Pain, Decreased mobility, Decreased strength, Decreased range of motion, Increased edema, Postural dysfunction, Increased muscle spasms, Obesity  Visit Diagnosis: Acute pain of left knee  Stiffness of left knee, not elsewhere classified  Difficulty in walking, not elsewhere classified  Muscle weakness (generalized)     Problem List Patient Active Problem List   Diagnosis Date Noted  . Renal hemorrhage, left 06/24/2015    HARRIS,KAREN  PTA  08/23/2016, 12:39 PM  [place], [place], [address] Phone: [phone]   Fax:  [phone]  Name: Margaret Phillips MRN: [id number] Date of Birth: June 24, 1961

## 2016-08-23 NOTE — Patient Instructions (Signed)
Use a cane due to 2 recent falls within the last week.

## 2016-08-23 NOTE — Therapy (Addendum)
[place], [place], [address] Phone: [phone]   Fax:  [phone]  Physical Therapy Treatment/Discharge  Patient Details  Name: Margaret Phillips MRN: [id number] Date of Birth: November 01, 1960 Referring Provider: Joni Fears MD  Encounter Date: 08/23/2016      PT End of Session - 08/23/16 1230    Visit Number 8   Number of Visits 16   Date for PT Re-Evaluation 09/22/16   PT Start Time 4944   PT Stop Time 1230   PT Time Calculation (min) 37 min   Activity Tolerance Other (comment)  not feeling well due to recent head injury   Behavior During Therapy [facility] for tasks assessed/performed      Past Medical History:  Diagnosis Date  . Factor V Leiden ([place])   . History of DVT of lower extremity   . History of nephrolithiasis   . History of pulmonary embolism    1987  &  1998  . Hyperlipidemia   . Mild obstructive sleep apnea    per pt study done 2006 (approx.)  mild OSA no cpap recommended  . PTSD (post-traumatic stress disorder)    NIGHT TERRORS  . Renal calculus, left     Past Surgical History:  Procedure Laterality Date  . CERVICAL FUSION  1997   C6 - 7  . CYSTOSCOPY W/ URETERAL STENT PLACEMENT Left 06/18/2015   Procedure: CYSTOSCOPY WITH STENT REPLACEMENT;  Surgeon: Cleon Gustin, MD;  Location: [facility];  Service: Urology;  Laterality: Left;  . CYSTOSCOPY WITH RETROGRADE PYELOGRAM, URETEROSCOPY AND STENT PLACEMENT Left 06/04/2015   Procedure: CYSTOSCOPY WITH RETROGRADE PYELOGRAM,  AND STENT PLACEMENT;  Surgeon: Cleon Gustin, MD;  Location: [facility];  Service: Urology;  Laterality: Left;  . CYSTOSCOPY WITH RETROGRADE PYELOGRAM, URETEROSCOPY AND STENT PLACEMENT Left 07/13/2015   Procedure: CYSTOSCOPY/RETROGRADE/STONE EXTRACTION WITH BASKET AND STENT EXCHANGE/PLACEMENT;  Surgeon: Cleon Gustin, MD;  Location: [facility];  Service: Urology;  Laterality: Left;   . CYSTOSCOPY WITH URETEROSCOPY Left 06/18/2015   Procedure: CYSTOSCOPY WITH URETEROSCOPY;  Surgeon: Cleon Gustin, MD;  Location: [facility];  Service: Urology;  Laterality: Left;  . CYSTOSCOPY/RETROGRADE/URETEROSCOPY/STONE EXTRACTION WITH BASKET Left 06/18/2015   Procedure: CYSTOSCOPY/RETROGRADE/STONE EXTRACTION WITH BASKET;  Surgeon: Cleon Gustin, MD;  Location: [facility];  Service: Urology;  Laterality: Left;  . HOLMIUM LASER APPLICATION Left 06/22/7590   Procedure: HOLMIUM LASER APPLICATION;  Surgeon: Cleon Gustin, MD;  Location: [facility];  Service: Urology;  Laterality: Left;    There were no vitals filed for this visit.      Subjective Assessment - 08/23/16 1156    Subjective  I FELL AGAIN ON fRIDAY.  walking up a ramp and one foot cought on the curb .  I have 6 stiches.  I really don't feel well, I am afraid if I work hard my head will hurt.  Pain Right wrist 3/10, head 5/10, right knee 3/10 (bruise) Left knee 2/10.  II had X ray anf CT scan of my head due to being on blood thinners.  I did not hurt my knee.    Currently in Pain? Yes   Pain Score 2    Pain Location Knee   Pain Orientation Left   Pain Descriptors / Indicators Sore  stiff mainly   Pain Type Acute pain   Pain Frequency Constant   Aggravating Factors  falling   Pain Relieving Factors  rest  elevation   Multiple Pain Sites --  See other pain from fall above            [facility] PT Assessment - 08/23/16 0001      PROM   Left Knee Flexion 118  Patient using hands.  No pain .  Stiff                     [facility] Adult PT Treatment/Exercise - 08/23/16 0001      Self-Care   Self-Care --  Use a cane for safety.     Knee/Hip Exercises: Stretches   Passive Hamstring Stretch 3 reps;30 seconds     Knee/Hip Exercises: Aerobic   Nustep 6 minutes L1 1.3 miles     Knee/Hip Exercises: Supine   Short Arc Quad Sets 2 sets;10 reps  2 LBS   Other  Supine Knee/Hip Exercises Manually resisted curls hamstring.      Cryotherapy   Number Minutes Cryotherapy 15 Minutes   Cryotherapy Location Knee   Type of Cryotherapy --  cold pack with leg elevated     Manual Therapy   Manual therapy comments retrograde soft tissue work,  PROM to decrease stiffness.                   PT Short Term Goals - 08/23/16 1235      PT SHORT TERM GOAL #1   Title "Independent with initial HEP   Baseline has not been consistant lately due to falls.  Understands what to do.   Time 4   Period Weeks   Status On-going     PT SHORT TERM GOAL #2   Title "Report pain decrease at rest from   5/10 to   2/10.   Baseline 2/10 at rest   Time 4   Period Weeks   Status Achieved     PT SHORT TERM GOAL #3   Title "Demonstrate and verbalize understanding of condition management including RICE, positioning, use of A.D., HEP to reduce pain and edema   Baseline understands use of RICE, Needs cues for use of AD.   Time 4   Period Weeks   Status Partially Met     PT SHORT TERM GOAL #4   Title Utilize walker without limping and using step through technique and heel toe contact   Baseline able,  No longer needs walker.   Time 4   Period Weeks   Status Achieved           PT Long Term Goals - 07/28/16 1310      PT LONG TERM GOAL #1   Title Pt will be independent with advanced home exercise program   Time 8   Period Weeks   Status New     PT LONG TERM GOAL #2   Title "Pain will decrease to 2/10 or less with all functional activities   Time 8   Period Weeks   Status New     PT LONG TERM GOAL #3   Title Left knee AAROM flexion will improve to 3-115 degrees for improved mobility to ride in car without increased pain   Time 8   Period Weeks   Status New     PT LONG TERM GOAL #4   Title Pt will be able to ambulate with LRAD at least 2.108f/sec to indicate improved mobility   Time 8   Period Weeks   Status New     PT LONG TERM GOAL #  5    Title "FOTO will improve from  74%limitation  to  52%limitation   indicating improved functional mobility .    Time 8   Period Weeks   Status New     Additional Long Term Goals   Additional Long Term Goals Yes     PT LONG TERM GOAL #6   Title Pt will be able to ascend/ descend steps with step over step technique and 2/10 or less pain in left knee   Time 8   Period Weeks   Status New               Plan - 08/23/16 1232    Clinical Impression Statement Patient has has 2 falls within the last week with 1 trip to emergency room with a head injury. She is encouraged to use a gain for gait.  PROM 118 flexion.  Short session today due to patient not feeling well and arriving a little late. No new goals met./ assessed.    PT Next Visit Plan See iof she can progress exercises.  Check to see if she is using a cane.  balance work.   PT Home Exercise Plan cryotherapy, level 1 knee exericises,  Knee band , flexion   Consulted and Agree with Plan of Care Patient      Patient will benefit from skilled therapeutic intervention in order to improve the following deficits and impairments:  Difficulty walking, Pain, Decreased mobility, Decreased strength, Decreased range of motion, Increased edema, Postural dysfunction, Increased muscle spasms, Obesity  Visit Diagnosis: Acute pain of left knee  Stiffness of left knee, not elsewhere classified  Difficulty in walking, not elsewhere classified  Muscle weakness (generalized)     Problem List Patient Active Problem List   Diagnosis Date Noted  . Renal hemorrhage, left 06/24/2015    HARRIS,KAREN PTA 08/23/2016, 6:18 PM  [place], [place], [address] Phone: [phone]   Fax:  [phone]  Name: SHANEKIA LATELLA MRN: [id number] Date of Birth: Feb 06, 1961  PHYSICAL THERAPY DISCHARGE SUMMARY  Visits from Start of Care: 8  Current functional level related to goals /  functional outcomes: See above   Remaining deficits: See above   Education / Equipment: Anatomy of condition POC, HEP exercise form/rationale  Plan: Patient agrees to discharge.  Patient goals were not met. Patient is being discharged due to not returning since the last visit.  ?????  Pt returned to MD for further treatment.   Jessica C. Hightower PT, DPT 10/13/16 2:37 PM

## 2016-08-24 NOTE — Therapy (Signed)
[place], [place], [address] Phone: [phone]   Fax:  [phone]  Physical Therapy Treatment  Patient Details  Name: Margaret Phillips MRN: [id number] Date of Birth: August 15, 1961 Referring Provider: Joni Fears MD  Encounter Date: 08/18/2016      PT End of Session - 08/23/16 1230    Visit Number 8   Number of Visits 16   Date for PT Re-Evaluation 09/22/16   PT Start Time 9767   PT Stop Time 1230   PT Time Calculation (min) 37 min   Activity Tolerance Other (comment)  not feeling well due to recent head injury   Behavior During Therapy [facility] for tasks assessed/performed      Past Medical History:  Diagnosis Date  . Factor V Leiden ([place])   . History of DVT of lower extremity   . History of nephrolithiasis   . History of pulmonary embolism    1987  &  1998  . Hyperlipidemia   . Mild obstructive sleep apnea    per pt study done 2006 (approx.)  mild OSA no cpap recommended  . PTSD (post-traumatic stress disorder)    NIGHT TERRORS  . Renal calculus, left     Past Surgical History:  Procedure Laterality Date  . CERVICAL FUSION  1997   C6 - 7  . CYSTOSCOPY W/ URETERAL STENT PLACEMENT Left 06/18/2015   Procedure: CYSTOSCOPY WITH STENT REPLACEMENT;  Surgeon: Cleon Gustin, MD;  Location: [facility];  Service: Urology;  Laterality: Left;  . CYSTOSCOPY WITH RETROGRADE PYELOGRAM, URETEROSCOPY AND STENT PLACEMENT Left 06/04/2015   Procedure: CYSTOSCOPY WITH RETROGRADE PYELOGRAM,  AND STENT PLACEMENT;  Surgeon: Cleon Gustin, MD;  Location[facility];  Service: Urology;  Laterality: Left;  . CYSTOSCOPY WITH RETROGRADE PYELOGRAM, URETEROSCOPY AND STENT PLACEMENT Left 07/13/2015   Procedure: CYSTOSCOPY/RETROGRADE/STONE EXTRACTION WITH BASKET AND STENT EXCHANGE/PLACEMENT;  Surgeon: Cleon Gustin, MD;  Location: [facility];  Service: Urology;  Laterality: Left;  .  CYSTOSCOPY WITH URETEROSCOPY Left 06/18/2015   Procedure: CYSTOSCOPY WITH URETEROSCOPY;  Surgeon: Cleon Gustin, MD;  Location: [facility];  Service: Urology;  Laterality: Left;  . CYSTOSCOPY/RETROGRADE/URETEROSCOPY/STONE EXTRACTION WITH BASKET Left 06/18/2015   Procedure: CYSTOSCOPY/RETROGRADE/STONE EXTRACTION WITH BASKET;  Surgeon: Cleon Gustin, MD;  Location: [facility];  Service: Urology;  Laterality: Left;  . HOLMIUM LASER APPLICATION Left 12/19/1935   Procedure: HOLMIUM LASER APPLICATION;  Surgeon: Cleon Gustin, MD;  Location: [facility];  Service: Urology;  Laterality: Left;    There were no vitals filed for this visit.      Subjective Assessment - 08/23/16 1156    Subjective  I FELL AGAIN ON fRIDAY.  walking up a ramp and one foot cought on the curb .  I have 6 stiches.  I really don't feel well, I am afraid if I work hard my head will hurt.  Pain Right wrist 3/10, head 5/10, right knee 3/10 (bruise) Left knee 2/10.  II had X ray anf CT scan of my head due to being on blood thinners.  I did not hurt my knee.    Currently in Pain? Yes   Pain Score 2    Pain Location Knee   Pain Orientation Left   Pain Descriptors / Indicators Sore  stiff mainly   Pain Type Acute pain   Pain Frequency Constant   Aggravating Factors  falling   Pain Relieving Factors  rest  elevation   Multiple Pain Sites --  See other pain from fall above       Gentle strengthening and ROM exercises today.  See exercise flow sheet                            PT Short Term Goals - 08/23/16 1235      PT SHORT TERM GOAL #1   Title "Independent with initial HEP   Baseline has not been consistant lately due to falls.  Understands what to do.   Time 4   Period Weeks   Status On-going     PT SHORT TERM GOAL #2   Title "Report pain decrease at rest from   5/10 to   2/10.   Baseline 2/10 at rest   Time 4   Period Weeks   Status  Achieved     PT SHORT TERM GOAL #3   Title "Demonstrate and verbalize understanding of condition management including RICE, positioning, use of A.D., HEP to reduce pain and edema   Baseline understands use of RICE, Needs cues for use of AD.   Time 4   Period Weeks   Status Partially Met     PT SHORT TERM GOAL #4   Title Utilize walker without limping and using step through technique and heel toe contact   Baseline able,  No longer needs walker.   Time 4   Period Weeks   Status Achieved           PT Long Term Goals - 07/28/16 1310      PT LONG TERM GOAL #1   Title Pt will be independent with advanced home exercise program   Time 8   Period Weeks   Status New     PT LONG TERM GOAL #2   Title "Pain will decrease to 2/10 or less with all functional activities   Time 8   Period Weeks   Status New     PT LONG TERM GOAL #3   Title Left knee AAROM flexion will improve to 3-115 degrees for improved mobility to ride in car without increased pain   Time 8   Period Weeks   Status New     PT LONG TERM GOAL #4   Title Pt will be able to ambulate with LRAD at least 2.58f/sec to indicate improved mobility   Time 8   Period Weeks   Status New     PT LONG TERM GOAL #5   Title "FOTO will improve from  74%limitation  to  52%limitation   indicating improved functional mobility .    Time 8   Period Weeks   Status New     Additional Long Term Goals   Additional Long Term Goals Yes     PT LONG TERM GOAL #6   Title Pt will be able to ascend/ descend steps with step over step technique and 2/10 or less pain in left knee   Time 8   Period Weeks   Status New               Plan - 08/23/16 1232    Clinical Impression Statement Patient has has 2 falls within the last week with 1 trip to emergency room with a head injury. She is encouraged to use a gain for gait.  PROM 118 flexion.  Short session today due to patient not feeling well and arriving a little late.  No new goals  met./ assessed.    PT Next Visit Plan See iof she can progress exercises.  Check to see if she is using a cane.  balance work.   PT Home Exercise Plan cryotherapy, level 1 knee exericises,  Knee band , flexion   Consulted and Agree with Plan of Care Patient      Patient will benefit from skilled therapeutic intervention in order to improve the following deficits and impairments:  Difficulty walking, Pain, Decreased mobility, Decreased strength, Decreased range of motion, Increased edema, Postural dysfunction, Increased muscle spasms, Obesity  Visit Diagnosis: Acute pain of left knee  Stiffness of left knee, not elsewhere classified  Difficulty in walking, not elsewhere classified  Muscle weakness (generalized)     Problem List Patient Active Problem List   Diagnosis Date Noted  . Renal hemorrhage, left 06/24/2015    Margaret Phillips PTA 08/24/2016, 1:57 PM  [facility] [address] [place], [place], [address] Phone: [phone]   Fax:  [phone]  Name: Margaret Phillips MRN: [id number] Date of Birth: 11/29/1960

## 2016-08-25 ENCOUNTER — Ambulatory Visit: Payer: BLUE CROSS/BLUE SHIELD | Admitting: Physical Therapy

## 2016-08-26 ENCOUNTER — Ambulatory Visit (INDEPENDENT_AMBULATORY_CARE_PROVIDER_SITE_OTHER): Payer: BLUE CROSS/BLUE SHIELD | Admitting: Orthopaedic Surgery

## 2016-09-07 ENCOUNTER — Encounter (INDEPENDENT_AMBULATORY_CARE_PROVIDER_SITE_OTHER): Payer: Self-pay | Admitting: Orthopedic Surgery

## 2016-09-07 ENCOUNTER — Ambulatory Visit (INDEPENDENT_AMBULATORY_CARE_PROVIDER_SITE_OTHER): Payer: BLUE CROSS/BLUE SHIELD | Admitting: Orthopedic Surgery

## 2016-09-07 VITALS — BP 143/78 | HR 71 | Resp 14 | Ht 66.0 in | Wt 285.0 lb

## 2016-09-07 DIAGNOSIS — S8001XA Contusion of right knee, initial encounter: Secondary | ICD-10-CM

## 2016-09-07 DIAGNOSIS — M25562 Pain in left knee: Secondary | ICD-10-CM

## 2016-09-07 DIAGNOSIS — G8929 Other chronic pain: Secondary | ICD-10-CM

## 2016-09-07 DIAGNOSIS — M23322 Other meniscus derangements, posterior horn of medial meniscus, left knee: Secondary | ICD-10-CM

## 2016-09-07 NOTE — Progress Notes (Signed)
Office Visit Note   Patient: Margaret Phillips           Date of Birth: September 08, 1961           MRN: [id number] Visit Date: 09/07/2016              Requested by: No referring provider defined for this encounter. PCP: PROVIDER NOT IN SYSTEM   Assessment & Plan: Visit Diagnoses:  1. Chronic pain of left knee   2. Other meniscus derangements, posterior horn of medial meniscus, left knee     Plan:  #1: Follow back up on December 13 for  for Hyalgan injection to the left knee  Follow-Up Instructions: Return in 3 weeks (on 09/28/2016) for Hyalgan.   Orders:  No orders of the defined types were placed in this encounter.  No orders of the defined types were placed in this encounter.     Procedures: No procedures performed   Clinical Data: No additional findings.   Subjective: Chief Complaint  Patient presents with  . Left Knee - Routine Post Op    Left knee arthroscopic 07/06/16, doing good, improving, Tylenol PRN - helps.  Margaret Phillips is here today for follow-up of her knee arthroscopic debridement with partial medial meniscectomy and chondroplasty recently she had a fall in the parking lot where she had tripped and landed onto her right knee and had a laceration of about  2-3 cm on the right forehead. She does have usage of Coumadin and was taken immediately to the emergency room. CT scan was negative for intracranial bleed.  She states that she did have pain in the right knee more in the inferior patellar area but this is also improving. She felt was just a contusion. She denies any worsening pain in the left knee. She states the left knee is improving. She is happy with the results at this time.    Review of Systems  Constitutional: Negative.   HENT: Negative.   Respiratory: Negative.   Cardiovascular: Negative.   Gastrointestinal: Negative.   Genitourinary: Negative.   Skin: Negative.   Neurological: Negative.   Hematological:        Liden factor 5  Psychiatric/Behavioral:  Negative.      Objective: Vital Signs: BP (!) 143/78 (BP Location: Left Arm, Patient Position: Sitting, Cuff Size: Large)   Pulse 71   Resp 14   Ht 5\' 6"  (1.676 m)   Wt 285 lb (129.3 kg)   BMI 46.00 kg/m   Physical Exam  Constitutional: She is oriented to person, place, and time. She appears well-developed and well-nourished.  HENT:  Head: Normocephalic and atraumatic.  Eyes: EOM are normal. Pupils are equal, round, and reactive to light.  Neck:  No carotid bruits  Pulmonary/Chest: Effort normal.  Musculoskeletal:       Left knee: She exhibits effusion (trace).  Neurological: She is alert and oriented to person, place, and time.  Skin: Skin is warm and dry.  Psychiatric: She has a normal mood and affect. Her behavior is normal. Judgment and thought content normal.    Right Knee Exam   Tenderness  The patient is experiencing tenderness in the patellar tendon.  Range of Motion  Extension: normal  Flexion: normal   Tests  Lachman:  Anterior - negative     Drawer:       Anterior - negative      Other  Sensation: normal Pulse: present Right knee swelling: mild bursal fluid.   Left Knee Exam  Tenderness  The patient is experiencing no tenderness.     Range of Motion  Extension: normal  Left knee flexion: 105.   Tests  Lachman:  Anterior - negative     Drawer:       Anterior - negative       Other  Scars: present Sensation: normal Pulse: present Effusion: effusion (trace) present      Specialty Comments:  No specialty comments available.  Imaging: No results found.   PMFS History: Patient Active Problem List   Diagnosis Date Noted  . Renal hemorrhage, left 06/24/2015   Past Medical History:  Diagnosis Date  . Factor V Leiden ([place])   . History of DVT of lower extremity   . History of nephrolithiasis   . History of pulmonary embolism    1987  &  1998  . Hyperlipidemia   . Mild obstructive sleep apnea    per pt study done 2006  (approx.)  mild OSA no cpap recommended  . PTSD (post-traumatic stress disorder)    NIGHT TERRORS  . Renal calculus, left     No family history on file.  Past Surgical History:  Procedure Laterality Date  . CERVICAL FUSION  1997   C6 - 7  . CYSTOSCOPY W/ URETERAL STENT PLACEMENT Left 06/18/2015   Procedure: CYSTOSCOPY WITH STENT REPLACEMENT;  Surgeon: Cleon Gustin, MD;  Location: [facility];  Service: Urology;  Laterality: Left;  . CYSTOSCOPY WITH RETROGRADE PYELOGRAM, URETEROSCOPY AND STENT PLACEMENT Left 06/04/2015   Procedure: CYSTOSCOPY WITH RETROGRADE PYELOGRAM,  AND STENT PLACEMENT;  Surgeon: Cleon Gustin, MD;  Location: [facility];  Service: Urology;  Laterality: Left;  . CYSTOSCOPY WITH RETROGRADE PYELOGRAM, URETEROSCOPY AND STENT PLACEMENT Left 07/13/2015   Procedure: CYSTOSCOPY/RETROGRADE/STONE EXTRACTION WITH BASKET AND STENT EXCHANGE/PLACEMENT;  Surgeon: Cleon Gustin, MD;  Location: [facility];  Service: Urology;  Laterality: Left;  . CYSTOSCOPY WITH URETEROSCOPY Left 06/18/2015   Procedure: CYSTOSCOPY WITH URETEROSCOPY;  Surgeon: Cleon Gustin, MD;  Location: [facility];  Service: Urology;  Laterality: Left;  . CYSTOSCOPY/RETROGRADE/URETEROSCOPY/STONE EXTRACTION WITH BASKET Left 06/18/2015   Procedure: CYSTOSCOPY/RETROGRADE/STONE EXTRACTION WITH BASKET;  Surgeon: Cleon Gustin, MD;  Location: [facility];  Service: Urology;  Laterality: Left;  . HOLMIUM LASER APPLICATION Left [id number]   Procedure: HOLMIUM LASER APPLICATION;  Surgeon: Cleon Gustin, MD;  Location: [facility];  Service: Urology;  Laterality: Left;   Social History   Occupational History  . Not on file.   Social History Main Topics  . Smoking status: Never Smoker  . Smokeless tobacco: Never Used  . Alcohol use 1.8 oz/week    3 Glasses of wine per week     Comment: weekends only  . Drug use: No  . Sexual  activity: Not on file

## 2016-09-28 ENCOUNTER — Ambulatory Visit (INDEPENDENT_AMBULATORY_CARE_PROVIDER_SITE_OTHER): Payer: BLUE CROSS/BLUE SHIELD | Admitting: Orthopedic Surgery

## 2016-09-28 DIAGNOSIS — M25562 Pain in left knee: Secondary | ICD-10-CM

## 2016-09-28 DIAGNOSIS — G8929 Other chronic pain: Secondary | ICD-10-CM | POA: Diagnosis not present

## 2016-09-28 DIAGNOSIS — D6851 Activated protein C resistance: Secondary | ICD-10-CM | POA: Insufficient documentation

## 2016-09-28 DIAGNOSIS — M1712 Unilateral primary osteoarthritis, left knee: Secondary | ICD-10-CM | POA: Insufficient documentation

## 2016-09-28 MED ORDER — SODIUM HYALURONATE (VISCOSUP) 20 MG/2ML IX SOSY
20.0000 mg | PREFILLED_SYRINGE | INTRA_ARTICULAR | Status: AC | PRN
  Administered 2016-09-28: 20 mg via INTRA_ARTICULAR

## 2016-09-28 MED ORDER — LIDOCAINE HCL 1 % IJ SOLN
3.0000 mL | INTRAMUSCULAR | Status: AC | PRN
  Administered 2016-09-28: 3 mL

## 2016-09-28 NOTE — Progress Notes (Signed)
Office Visit Note   Patient: Margaret Phillips           Date of Birth: 1961-01-31           MRN: [id number] Visit Date: 09/28/2016              Requested by: No referring provider defined for this encounter. PCP: PROVIDER NOT IN SYSTEM   Assessment & Plan: Visit Diagnoses:  1. Primary osteoarthritis of left knee   2. Chronic pain of left knee   3. Factor V Leiden ([place])    Plan: #1: The remaining fourth Hyalgan injection was given without difficulty #2: Follow-up in one week for the fifth Hyalgan injection  Follow-Up Instructions: Return in about 1 week (around 10/05/2016).   Orders:  Orders Placed This Encounter  Procedures  . Large Joint Injection/Arthrocentesis   No orders of the defined types were placed in this encounter.     Procedures: Large Joint Inj Date/Time: 09/28/2016 12:21 PM Performed by: Biagio Borg D Authorized by: Biagio Borg D   Consent Given by:  Patient Timeout: prior to procedure the correct patient, procedure, and site was verified   Indications:  Pain and joint swelling Location:  Knee Site:  L knee Prep: patient was prepped and draped in usual sterile fashion   Needle Size:  25 G Needle Length:  1.5 inches Approach:  Anteromedial Ultrasound Guidance: No   Fluoroscopic Guidance: No   Arthrogram: No   Medications:  3 mL lidocaine 1 %; 20 mg Sodium Hyaluronate 20 MG/2ML Aspiration Attempted: No   Patient tolerance:  Patient tolerated the procedure well with no immediate complications     Clinical Data: No additional findings.   Subjective: Chief Complaint  Patient presents with  . Injections    left knee hyalgan patient purchased #4    Margaret Phillips is a very pleasant 55 year old white female who is seen today for evaluation of her left knee. We have in the past seen her for left knee arthritis and was starting a series of Hyalgan injections when after the third injection she was not having much benefit and actually got to the point  where she had to use a walker. At that time I obtained an MRI scan which revealed a complex tear involving the posterior horn mid body junction region and a large radial tear extending to a free edge and undersurface tear more anteriorly. An arthroscopic debridement was performed and she is done very well with that so far. She does have some symptoms in the knee of her arthritis but these are mild and would like to consider having her fourth and fifth Hyalgan injections performed and hopefully that will improve her symptoms. She has a trip to the  [employer] scheduled for a month.    Review of Systems  Constitutional: Negative.   HENT: Negative.   Respiratory: Negative.   Cardiovascular: Negative.   Gastrointestinal: Negative.   Genitourinary: Negative.   Skin: Negative.   Neurological: Negative.   Hematological:        Liden factor 5  Psychiatric/Behavioral: Negative.      Objective: Vital Signs: There were no vitals taken for this visit.  Physical Exam  Constitutional: She is oriented to person, place, and time. She appears well-developed and well-nourished.  HENT:  Head: Normocephalic and atraumatic.  Eyes: EOM are normal. Pupils are equal, round, and reactive to light.  Neck:  No carotid bruits  Pulmonary/Chest: Effort normal.  Musculoskeletal:  Left knee: She exhibits effusion (trace).  Neurological: She is alert and oriented to person, place, and time.  Skin: Skin is warm and dry.  Psychiatric: She has a normal mood and affect. Her behavior is normal. Judgment and thought content normal.    Left Knee Exam   Tenderness  The patient is experiencing no tenderness.     Range of Motion  Extension: 0  Flexion: 100   Other  Sensation: normal Pulse: present Swelling: mild Effusion: effusion (trace) present      Specialty Comments:  No specialty comments available.  Imaging: No results found.   PMFS History: Patient Active Problem List    Diagnosis Date Noted  . Factor V Leiden ([place]) 09/28/2016  . Primary osteoarthritis of left knee 09/28/2016  . Renal hemorrhage, left 06/24/2015   Past Medical History:  Diagnosis Date  . Factor V Leiden ([place])   . History of DVT of lower extremity   . History of nephrolithiasis   . History of pulmonary embolism    1987  &  1998  . Hyperlipidemia   . Mild obstructive sleep apnea    per pt study done 2006 (approx.)  mild OSA no cpap recommended  . PTSD (post-traumatic stress disorder)    NIGHT TERRORS  . Renal calculus, left     No family history on file.  Past Surgical History:  Procedure Laterality Date  . CERVICAL FUSION  1997   C6 - 7  . CYSTOSCOPY W/ URETERAL STENT PLACEMENT Left 06/18/2015   Procedure: CYSTOSCOPY WITH STENT REPLACEMENT;  Surgeon: Cleon Gustin, MD;  Location: [facility];  Service: Urology;  Laterality: Left;  . CYSTOSCOPY WITH RETROGRADE PYELOGRAM, URETEROSCOPY AND STENT PLACEMENT Left 06/04/2015   Procedure: CYSTOSCOPY WITH RETROGRADE PYELOGRAM,  AND STENT PLACEMENT;  Surgeon: Cleon Gustin, MD;  Location: [facility];  Service: Urology;  Laterality: Left;  . CYSTOSCOPY WITH RETROGRADE PYELOGRAM, URETEROSCOPY AND STENT PLACEMENT Left 07/13/2015   Procedure: CYSTOSCOPY/RETROGRADE/STONE EXTRACTION WITH BASKET AND STENT EXCHANGE/PLACEMENT;  Surgeon: Cleon Gustin, MD;  Location: [facility];  Service: Urology;  Laterality: Left;  . CYSTOSCOPY WITH URETEROSCOPY Left 06/18/2015   Procedure: CYSTOSCOPY WITH URETEROSCOPY;  Surgeon: Cleon Gustin, MD;  Location: [facility];  Service: Urology;  Laterality: Left;  . CYSTOSCOPY/RETROGRADE/URETEROSCOPY/STONE EXTRACTION WITH BASKET Left 06/18/2015   Procedure: CYSTOSCOPY/RETROGRADE/STONE EXTRACTION WITH BASKET;  Surgeon: Cleon Gustin, MD;  Location: [facility];  Service: Urology;  Laterality: Left;  . HOLMIUM LASER APPLICATION Left [id number]    Procedure: HOLMIUM LASER APPLICATION;  Surgeon: Cleon Gustin, MD;  Location: [facility];  Service: Urology;  Laterality: Left;   Social History   Occupational History  . Not on file.   Social History Main Topics  . Smoking status: Never Smoker  . Smokeless tobacco: Never Used  . Alcohol use 1.8 oz/week    3 Glasses of wine per week     Comment: weekends only  . Drug use: No  . Sexual activity: Not on file

## 2016-10-05 ENCOUNTER — Ambulatory Visit (INDEPENDENT_AMBULATORY_CARE_PROVIDER_SITE_OTHER): Payer: BLUE CROSS/BLUE SHIELD | Admitting: Orthopedic Surgery

## 2016-10-05 DIAGNOSIS — M1712 Unilateral primary osteoarthritis, left knee: Secondary | ICD-10-CM | POA: Diagnosis not present

## 2016-10-05 MED ORDER — LIDOCAINE HCL 1 % IJ SOLN
3.0000 mL | INTRAMUSCULAR | Status: AC | PRN
  Administered 2016-10-05: 3 mL

## 2016-10-05 MED ORDER — SODIUM HYALURONATE (VISCOSUP) 20 MG/2ML IX SOSY
20.0000 mg | PREFILLED_SYRINGE | INTRA_ARTICULAR | Status: AC | PRN
  Administered 2016-10-05: 20 mg via INTRA_ARTICULAR

## 2016-10-05 NOTE — Progress Notes (Deleted)
Procedures: No procedures performed   Clinical Data: No additional findings.   Subjective: No chief complaint on file.   Margaret Phillips is a very pleasant 55 year old white female who is seen today for evaluation of her left knee. We have in the past seen her for left knee arthritis and was starting a series of Hyalgan injections when after the third injection she was not having much benefit and actually got to the point where she had to use a walker. At that time I obtained an MRI scan which revealed a complex tear involving the posterior horn mid body junction region and a large radial tear extending to a free edge and undersurface tear more anteriorly. An arthroscopic debridement was performed and she is done very well with that so far. She does have some symptoms in the knee of her arthritis but these are mild and would like to consider having her fourth and fifth Hyalgan injections performed and hopefully that will improve her symptoms. She has a trip to the  [employer] scheduled for a month.   The fourth injection was given 1 week ago and she actually had improvement. Denies any reactivity. Seen today for her final Hyalgan injection.    Review of Systems  Constitutional: Negative.   HENT: Negative.   Respiratory: Negative.   Cardiovascular: Negative.   Gastrointestinal: Negative.   Genitourinary: Negative.   Skin: Negative.   Neurological: Negative.   Hematological:        Liden factor 5  Psychiatric/Behavioral: Negative.      Office Visit Note   Patient: Margaret Phillips           Date of Birth: 07-25-1961           MRN: [id number] Visit Date: 10/05/2016              Requested by: No referring provider defined for this encounter. PCP: PROVIDER NOT IN SYSTEM   Assessment & Plan: Visit Diagnoses:  1. Unilateral primary osteoarthritis, left knee    Plan: #1: The remaining fourth Hyalgan injection was given without difficulty #2: Follow-up in one week for the fifth  Hyalgan injection  Follow-Up Instructions: Return if symptoms worsen or fail to improve.   Orders:  No orders of the defined types were placed in this encounter.  No orders of the defined types were placed in this encounter.     Procedures: Large Joint Inj Date/Time: 09/28/2016 12:21 PM Performed by: Biagio Borg D Authorized by: Biagio Borg D   Consent Given by:  Patient Timeout: prior to procedure the correct patient, procedure, and site was verified   Indications:  Pain and joint swelling Location:  Knee Site:  L knee Prep: patient was prepped and draped in usual sterile fashion   Needle Size:  25 G Needle Length:  1.5 inches Approach:  Anteromedial portal Ultrasound Guidance: No   Fluoroscopic Guidance: No   Arthrogram: No   Medications:  3 mL lidocaine 1 %; 20 mg Sodium Hyaluronate 20 MG/2ML Aspiration Attempted: No   Patient tolerance:  Patient tolerated the procedure well with no immediate complications     Clinical Data: No additional findings.   Subjective: No chief complaint on file.   Margaret Phillips is a very pleasant 55 year old white female who is seen today for evaluation of her left knee. We have in the past seen her for left knee arthritis and was starting a series of Hyalgan injections when after the third injection she was not  having much benefit and actually got to the point where she had to use a walker. At that time I obtained an MRI scan which revealed a complex tear involving the posterior horn mid body junction region and a large radial tear extending to a free edge and undersurface tear more anteriorly. An arthroscopic debridement was performed and she is done very well with that so far. She does have some symptoms in the knee of her arthritis but these are mild and would like to consider having her fourth and fifth Hyalgan injections performed and hopefully that will improve her symptoms. She has a trip to the  [employer] scheduled for a  month.    Review of Systems  Constitutional: Negative.   HENT: Negative.   Respiratory: Negative.   Cardiovascular: Negative.   Gastrointestinal: Negative.   Genitourinary: Negative.   Skin: Negative.   Neurological: Negative.   Hematological:        Liden factor 5  Psychiatric/Behavioral: Negative.      Objective: Vital Signs: There were no vitals taken for this visit.  Physical Exam  Constitutional: She is oriented to person, place, and time. She appears well-developed and well-nourished.  HENT:  Head: Normocephalic and atraumatic.  Eyes: EOM are normal. Pupils are equal, round, and reactive to light.  Neck:  No carotid bruits    Office Visit Note   Patient: Margaret Phillips           Date of Birth: 1960-11-07           MRN: [id number] Visit Date: 10/05/2016              Requested by: No referring provider defined for this encounter. PCP: PROVIDER NOT IN SYSTEM   Assessment & Plan: Visit Diagnoses:  1. Unilateral primary osteoarthritis, left knee    Plan: #1: The remaining fourth Hyalgan injection was given without difficulty #2: Follow-up in one week for the fifth Hyalgan injection  Follow-Up Instructions: Return if symptoms worsen or fail to improve.   Orders:  No orders of the defined types were placed in this encounter.  No orders of the defined types were placed in this encounter.     Procedures: Large Joint Inj Date/Time: 09/28/2016 12:21 PM Performed by: Biagio Borg D Authorized by: Biagio Borg D   Consent Given by:  Patient Timeout: prior to procedure the correct patient, procedure, and site was verified   Indications:  Pain and joint swelling Location:  Knee Site:  L knee Prep: patient was prepped and draped in usual sterile fashion   Needle Size:  25 G Needle Length:  1.5 inches Approach:  Anteromedial Ultrasound Guidance: No   Fluoroscopic Guidance: No   Arthrogram: No   Medications:  3 mL lidocaine 1 %; 20 mg Sodium  Hyaluronate 20 MG/2ML Aspiration Attempted: No   Patient tolerance:  Patient tolerated the procedure well with no immediate complications     Clinical Data: No additional findings.   Subjective: No chief complaint on file.   Margaret Phillips is a very pleasant 55 year old white female who is seen today for evaluation of her left knee. We have in the past seen her for left knee arthritis and was starting a series of Hyalgan injections when after the third injection she was not having much benefit and actually got to the point where she had to use a walker. At that time I obtained an MRI scan which revealed a complex tear involving the posterior horn mid body junction region  and a large radial tear extending to a free edge and undersurface tear more anteriorly. An arthroscopic debridement was performed and she is done very well with that so far. She does have some symptoms in the knee of her arthritis but these are mild and would like to consider having her fourth and fifth Hyalgan injections performed and hopefully that will improve her symptoms. She has a trip to the  [employer] scheduled for a month.    Review of Systems  Constitutional: Negative.   HENT: Negative.   Respiratory: Negative.   Cardiovascular: Negative.   Gastrointestinal: Negative.   Genitourinary: Negative.   Skin: Negative.   Neurological: Negative.   Hematological:        Liden factor 5  Psychiatric/Behavioral: Negative.      Objective: Vital Signs: There were no vitals taken for this visit.  Physical Exam  Constitutional: She is oriented to person, place, and time. She appears well-developed and well-nourished.  HENT:  Head: Normocephalic and atraumatic.  Eyes: EOM are normal. Pupils are equal, round, and reactive to light.  Neck:  No carotid bruits  Pulmonary/Chest: Effort normal.  Musculoskeletal:       Left knee: She exhibits effusion (trace).  Neurological: She is alert and oriented to person, place,  and time.  Skin: Skin is warm and dry.  Psychiatric: She has a normal mood and affect. Her behavior is normal. Judgment and thought content normal.    Left Knee Exam   Tenderness  The patient is experiencing no tenderness.     Range of Motion  Extension: 0  Flexion: 100   Other  Sensation: normal Pulse: present Swelling: mild Effusion: effusion (trace) present      Specialty Comments:  No specialty comments available.  Imaging: No results found.   PMFS History: Patient Active Problem List   Diagnosis Date Noted  . Factor V Leiden ([place]) 09/28/2016  . Primary osteoarthritis of left knee 09/28/2016  . Renal hemorrhage, left 06/24/2015   Past Medical History:  Diagnosis Date  . Factor V Leiden ([place])   . History of DVT of lower extremity   . History of nephrolithiasis   . History of pulmonary embolism    1987  &  1998  . Hyperlipidemia   . Mild obstructive sleep apnea    per pt study done 2006 (approx.)  mild OSA no cpap recommended  . PTSD (post-traumatic stress disorder)    NIGHT TERRORS  . Renal calculus, left     No family history on file.  Past Surgical History:  Procedure Laterality Date  . CERVICAL FUSION  1997   C6 - 7  . CYSTOSCOPY W/ URETERAL STENT PLACEMENT Left 06/18/2015   Procedure: CYSTOSCOPY WITH STENT REPLACEMENT;  Surgeon: Cleon Gustin, MD;  Location: [facility];  Service: Urology;  Laterality: Left;  . CYSTOSCOPY WITH RETROGRADE PYELOGRAM, URETEROSCOPY AND STENT PLACEMENT Left 06/04/2015   Procedure: CYSTOSCOPY WITH RETROGRADE PYELOGRAM,  AND STENT PLACEMENT;  Surgeon: Cleon Gustin, MD;  Location: [facility];  Service: Urology;  Laterality: Left;  . CYSTOSCOPY WITH RETROGRADE PYELOGRAM, URETEROSCOPY AND STENT PLACEMENT Left 07/13/2015   Procedure: CYSTOSCOPY/RETROGRADE/STONE EXTRACTION WITH BASKET AND STENT EXCHANGE/PLACEMENT;  Surgeon: Cleon Gustin, MD;  Location: [facility];  Service: Urology;   Laterality: Left;  . CYSTOSCOPY WITH URETEROSCOPY Left 06/18/2015   Procedure: CYSTOSCOPY WITH URETEROSCOPY;  Surgeon: Cleon Gustin, MD;  Location: [facility];  Service: Urology;  Laterality: Left;  .  CYSTOSCOPY/RETROGRADE/URETEROSCOPY/STONE EXTRACTION WITH BASKET Left 06/18/2015   Procedure: CYSTOSCOPY/RETROGRADE/STONE EXTRACTION WITH BASKET;  Surgeon: Cleon Gustin, MD;  Location: [facility];  Service: Urology;  Laterality: Left;  . HOLMIUM LASER APPLICATION Left [id number]   Procedure: HOLMIUM LASER APPLICATION;  Surgeon: Cleon Gustin, MD;  Location: [facility];  Service: Urology;  Laterality: Left;   Social History   Occupational History  . Not on file.   Social History Main Topics  . Smoking status: Never Smoker  . Smokeless tobacco: Never Used  . Alcohol use 1.8 oz/week    3 Glasses of wine per week     Comment: weekends only  . Drug use: No  . Sexual activity: Not on file       Pulmonary/Chest: Effort normal.  Musculoskeletal:       Left knee: She exhibits effusion (trace).  Neurological: She is alert and oriented to person, place, and time.  Skin: Skin is warm and dry.  Psychiatric: She has a normal mood and affect. Her behavior is normal. Judgment and thought content normal.    Left Knee Exam   Tenderness  The patient is experiencing no tenderness.     Range of Motion  Extension: 0  Flexion: 100   Other  Sensation: normal Pulse: present Swelling: mild Effusion: effusion (trace) present      Specialty Comments:  No specialty comments available.  Imaging: No results found.   PMFS History: Patient Active Problem List   Diagnosis Date Noted  . Factor V Leiden ([place]) 09/28/2016  . Primary osteoarthritis of left knee 09/28/2016  . Renal hemorrhage, left 06/24/2015   Past Medical History:  Diagnosis Date  . Factor V Leiden ([place])   . History of DVT of lower extremity   . History of  nephrolithiasis   . History of pulmonary embolism    1987  &  1998  . Hyperlipidemia   . Mild obstructive sleep apnea    per pt study done 2006 (approx.)  mild OSA no cpap recommended  . PTSD (post-traumatic stress disorder)    NIGHT TERRORS  . Renal calculus, left     No family history on file.  Past Surgical History:  Procedure Laterality Date  . CERVICAL FUSION  1997   C6 - 7  . CYSTOSCOPY W/ URETERAL STENT PLACEMENT Left 06/18/2015   Procedure: CYSTOSCOPY WITH STENT REPLACEMENT;  Surgeon: Cleon Gustin, MD;  Location: [facility];  Service: Urology;  Laterality: Left;  . CYSTOSCOPY WITH RETROGRADE PYELOGRAM, URETEROSCOPY AND STENT PLACEMENT Left 06/04/2015   Procedure: CYSTOSCOPY WITH RETROGRADE PYELOGRAM,  AND STENT PLACEMENT;  Surgeon: Cleon Gustin, MD;  Location: [facility];  Service: Urology;  Laterality: Left;  . CYSTOSCOPY WITH RETROGRADE PYELOGRAM, URETEROSCOPY AND STENT PLACEMENT Left 07/13/2015   Procedure: CYSTOSCOPY/RETROGRADE/STONE EXTRACTION WITH BASKET AND STENT EXCHANGE/PLACEMENT;  Surgeon: Cleon Gustin, MD;  Location: [facility];  Service: Urology;  Laterality: Left;  . CYSTOSCOPY WITH URETEROSCOPY Left 06/18/2015   Procedure: CYSTOSCOPY WITH URETEROSCOPY;  Surgeon: Cleon Gustin, MD;  Location: [facility];  Service: Urology;  Laterality: Left;  . CYSTOSCOPY/RETROGRADE/URETEROSCOPY/STONE EXTRACTION WITH BASKET Left 06/18/2015   Procedure: CYSTOSCOPY/RETROGRADE/STONE EXTRACTION WITH BASKET;  Surgeon: Cleon Gustin, MD;  Location: [facility];  Service: Urology;  Laterality: Left;  . HOLMIUM LASER APPLICATION Left [id number]   Procedure: HOLMIUM LASER APPLICATION;  Surgeon: Cleon Gustin, MD;  Location: [facility];  Service: Urology;  Laterality: Left;   Social History   Occupational History  . Not on file.   Social History Main Topics  . Smoking status: Never Smoker  .  Smokeless tobacco: Never Used  . Alcohol use 1.8 oz/week    3 Glasses of wine per week     Comment: weekends only  . Drug use: No  . Sexual activity: Not on file        Office Visit Note   Patient: Margaret Phillips           Date of Birth: 21-Jan-1961           MRN: [id number] Visit Date: 10/05/2016              Requested by: No referring provider defined for this encounter. PCP: PROVIDER NOT IN SYSTEM   Assessment & Plan: Visit Diagnoses:  1. Unilateral primary osteoarthritis, left knee    Plan: #1: The remaining fourth Hyalgan injection was given without difficulty #2: Follow-up in one week for the fifth Hyalgan injection  Follow-Up Instructions: Return if symptoms worsen or fail to improve.   Orders:  No orders of the defined types were placed in this encounter.  No orders of the defined types were placed in this encounter.     Procedures: Large Joint Inj Date/Time: 09/28/2016 12:21 PM Performed by: Biagio Borg D Authorized by: Biagio Borg D   Consent Given by:  Patient Timeout: prior to procedure the correct patient, procedure, and site was verified   Indications:  Pain and joint swelling Location:  Knee Site:  L knee Prep: patient was prepped and draped in usual sterile fashion   Needle Size:  25 G Needle Length:  1.5 inches Approach:  Anteromedial Ultrasound Guidance: No   Fluoroscopic Guidance: No   Arthrogram: No   Medications:  3 mL lidocaine 1 %; 20 mg Sodium Hyaluronate 20 MG/2ML Aspiration Attempted: No   Patient tolerance:  Patient tolerated the procedure well with no immediate complications     Clinical Data: No additional findings.   Subjective: No chief complaint on file.   Margaret Phillips is a very pleasant 55 year old white female who is seen today for evaluation of her left knee. We have in the past seen her for left knee arthritis and was starting a series of Hyalgan injections when after the third injection she was not having much  benefit and actually got to the point where she had to use a walker. At that time I obtained an MRI scan which revealed a complex tear involving the posterior horn mid body junction region and a large radial tear extending to a free edge and undersurface tear more anteriorly. An arthroscopic debridement was performed and she is done very well with that so far. She does have some symptoms in the knee of her arthritis but these are mild and would like to consider having her fourth and fifth Hyalgan injections performed and hopefully that will improve her symptoms. She has a trip to the  [employer] scheduled for a month.    Review of Systems  Constitutional: Negative.   HENT: Negative.   Respiratory: Negative.   Cardiovascular: Negative.   Gastrointestinal: Negative.   Genitourinary: Negative.   Skin: Negative.   Neurological: Negative.   Hematological:        Liden factor 5  Psychiatric/Behavioral: Negative.      Objective: Vital Signs: There were no vitals taken for this visit.  Physical Exam  Constitutional: She is oriented to person,  place, and time. She appears well-developed and well-nourished.  HENT:  Head: Normocephalic and atraumatic.  Eyes: EOM are normal. Pupils are equal, round, and reactive to light.  Neck:  No carotid bruits  Pulmonary/Chest: Effort normal.  Musculoskeletal:       Left knee: She exhibits effusion (trace).  Neurological: She is alert and oriented to person, place, and time.  Skin: Skin is warm and dry.  Psychiatric: She has a normal mood and affect. Her behavior is normal. Judgment and thought content normal.    Left Knee Exam   Tenderness  The patient is experiencing no tenderness.     Range of Motion  Extension: 0  Flexion: 100   Other  Sensation: normal Pulse: present Swelling: mild Effusion: effusion (trace) present      Specialty Comments:  No specialty comments available.  Imaging: No results found.   PMFS  History: Patient Active Problem List   Diagnosis Date Noted  . Factor V Leiden ([place]) 09/28/2016  . Primary osteoarthritis of left knee 09/28/2016  . Renal hemorrhage, left 06/24/2015   Past Medical History:  Diagnosis Date  . Factor V Leiden ([place])   . History of DVT of lower extremity   . History of nephrolithiasis   . History of pulmonary embolism    1987  &  1998  . Hyperlipidemia   . Mild obstructive sleep apnea    per pt study done 2006 (approx.)  mild OSA no cpap recommended  . PTSD (post-traumatic stress disorder)    NIGHT TERRORS  . Renal calculus, left     No family history on file.  Past Surgical History:  Procedure Laterality Date  . CERVICAL FUSION  1997   C6 - 7  . CYSTOSCOPY W/ URETERAL STENT PLACEMENT Left 06/18/2015   Procedure: CYSTOSCOPY WITH STENT REPLACEMENT;  Surgeon: Cleon Gustin, MD;  Location: [facility];  Service: Urology;  Laterality: Left;  . CYSTOSCOPY WITH RETROGRADE PYELOGRAM, URETEROSCOPY AND STENT PLACEMENT Left 06/04/2015   Procedure: CYSTOSCOPY WITH RETROGRADE PYELOGRAM,  AND STENT PLACEMENT;  Surgeon: Cleon Gustin, MD;  Location: [facility];  Service: Urology;  Laterality: Left;  . CYSTOSCOPY WITH RETROGRADE PYELOGRAM, URETEROSCOPY AND STENT PLACEMENT Left 07/13/2015   Procedure: CYSTOSCOPY/RETROGRADE/STONE EXTRACTION WITH BASKET AND STENT EXCHANGE/PLACEMENT;  Surgeon: Cleon Gustin, MD;  Location: [facility];  Service: Urology;  Laterality: Left;  . CYSTOSCOPY WITH URETEROSCOPY Left 06/18/2015   Procedure: CYSTOSCOPY WITH URETEROSCOPY;  Surgeon: Cleon Gustin, MD;  Location: [facility];  Service: Urology;  Laterality: Left;  . CYSTOSCOPY/RETROGRADE/URETEROSCOPY/STONE EXTRACTION WITH BASKET Left 06/18/2015   Procedure: CYSTOSCOPY/RETROGRADE/STONE EXTRACTION WITH BASKET;  Surgeon: Cleon Gustin, MD;  Location: [facility];  Service: Urology;  Laterality: Left;  . HOLMIUM  LASER APPLICATION Left [id number]   Procedure: HOLMIUM LASER APPLICATION;  Surgeon: Cleon Gustin, MD;  Location: [facility];  Service: Urology;  Laterality: Left;   Social History   Occupational History  . Not on file.   Social History Main Topics  . Smoking status: Never Smoker  . Smokeless tobacco: Never Used  . Alcohol use 1.8 oz/week    3 Glasses of wine per week     Comment: weekends only  . Drug use: No  . Sexual activity: Not on file         Objective: Vital Signs: There were no vitals taken for this visit.  Physical Exam  Constitutional: She is oriented to person,  place, and time. She appears well-developed and well-nourished.  HENT:  Head: Normocephalic and atraumatic.  Eyes: EOM are normal. Pupils are equal, round, and reactive to light.  Neck:  No carotid bruits  Pulmonary/Chest: Effort normal.  Neurological: She is alert and oriented to person, place, and time.  Skin: Skin is warm and dry.  Psychiatric: She has a normal mood and affect. Her behavior is normal. Judgment and thought content normal.    Ortho Exam  Specialty Comments:  No specialty comments available.  Imaging: No results found.   PMFS History: Patient Active Problem List   Diagnosis Date Noted  . Factor V Leiden ([place]) 09/28/2016  . Primary osteoarthritis of left knee 09/28/2016  . Renal hemorrhage, left 06/24/2015   Past Medical History:  Diagnosis Date  . Factor V Leiden ([place])   . History of DVT of lower extremity   . History of nephrolithiasis   . History of pulmonary embolism    1987  &  1998  . Hyperlipidemia   . Mild obstructive sleep apnea    per pt study done 2006 (approx.)  mild OSA no cpap recommended  . PTSD (post-traumatic stress disorder)    NIGHT TERRORS  . Renal calculus, left     No family history on file.  Past Surgical History:  Procedure Laterality Date  . CERVICAL FUSION  1997   C6 - 7  . CYSTOSCOPY W/ URETERAL STENT PLACEMENT  Left 06/18/2015   Procedure: CYSTOSCOPY WITH STENT REPLACEMENT;  Surgeon: Cleon Gustin, MD;  Location: [facility];  Service: Urology;  Laterality: Left;  . CYSTOSCOPY WITH RETROGRADE PYELOGRAM, URETEROSCOPY AND STENT PLACEMENT Left 06/04/2015   Procedure: CYSTOSCOPY WITH RETROGRADE PYELOGRAM,  AND STENT PLACEMENT;  Surgeon: Cleon Gustin, MD;  Location: [facility];  Service: Urology;  Laterality: Left;  . CYSTOSCOPY WITH RETROGRADE PYELOGRAM, URETEROSCOPY AND STENT PLACEMENT Left 07/13/2015   Procedure: CYSTOSCOPY/RETROGRADE/STONE EXTRACTION WITH BASKET AND STENT EXCHANGE/PLACEMENT;  Surgeon: Cleon Gustin, MD;  Location: [facility];  Service: Urology;  Laterality: Left;  . CYSTOSCOPY WITH URETEROSCOPY Left 06/18/2015   Procedure: CYSTOSCOPY WITH URETEROSCOPY;  Surgeon: Cleon Gustin, MD;  Location: [facility];  Service: Urology;  Laterality: Left;  . CYSTOSCOPY/RETROGRADE/URETEROSCOPY/STONE EXTRACTION WITH BASKET Left 06/18/2015   Procedure: CYSTOSCOPY/RETROGRADE/STONE EXTRACTION WITH BASKET;  Surgeon: Cleon Gustin, MD;  Location: [facility];  Service: Urology;  Laterality: Left;  . HOLMIUM LASER APPLICATION Left [id number]   Procedure: HOLMIUM LASER APPLICATION;  Surgeon: Cleon Gustin, MD;  Location: [facility];  Service: Urology;  Laterality: Left;   Social History   Occupational History  . Not on file.   Social History Main Topics  . Smoking status: Never Smoker  . Smokeless tobacco: Never Used  . Alcohol use 1.8 oz/week    3 Glasses of wine per week     Comment: weekends only  . Drug use: No  . Sexual activity: Not on file

## 2016-10-05 NOTE — Progress Notes (Signed)
Office Visit Note   Patient: Margaret Phillips           Date of Birth: 06/07/1961           MRN: [id number] Visit Date: 10/05/2016              Requested by: No referring provider defined for this encounter. PCP: PROVIDER NOT IN SYSTEM   Assessment & Plan: Visit Diagnoses:  1. Unilateral primary osteoarthritis, left knee    Plan: #1: The remaining fourth Hyalgan injection was given without difficulty #2: Follow-up in one week for the fifth Hyalgan injection  Follow-Up Instructions: Return if symptoms worsen or fail to improve.   Orders:  No orders of the defined types were placed in this encounter.  No orders of the defined types were placed in this encounter.     Procedures: Large Joint Inj Date/Time: 10/05/2016 2:54 PM Performed by: Biagio Borg D Authorized by: Biagio Borg D   Consent Given by:  Patient Timeout: prior to procedure the correct patient, procedure, and site was verified   Indications:  Pain and joint swelling Location:  Knee Site:  L knee Prep: patient was prepped and draped in usual sterile fashion   Needle Size:  25 G Needle Length:  1.5 inches Approach:  Anteromedial Ultrasound Guidance: No   Fluoroscopic Guidance: No   Arthrogram: No   Medications:  3 mL lidocaine 1 %; 20 mg Sodium Hyaluronate 20 MG/2ML Aspiration Attempted: No   Patient tolerance:  Patient tolerated the procedure well with no immediate complications     Clinical Data: No additional findings.    Subjective: No chief complaint on file.   Margaret Phillips is a very pleasant 55 year old white female who is seen today for evaluation of her left knee. We have in the past seen her for left knee arthritis and was starting a series of Hyalgan injections when after the third injection she was not having much benefit and actually got to the point where she had to use a walker. At that time I obtained an MRI scan which revealed a complex tear involving the posterior horn mid body  junction region and a large radial tear extending to a free edge and undersurface tear more anteriorly. An arthroscopic debridement was performed and she is done very well with that so far. She does have some symptoms in the knee of her arthritis but these are mild and has chosen to complete the last 2 injections. He is here today for the fifth Hyalgan injection. Eyes every activity.    Review of Systems  Constitutional: Negative.   HENT: Negative.   Respiratory: Negative.   Cardiovascular: Negative.   Gastrointestinal: Negative.   Genitourinary: Negative.   Skin: Negative.   Neurological: Negative.   Hematological:        Liden factor 5  Psychiatric/Behavioral: Negative.      Objective: Vital Signs: There were no vitals taken for this visit.  Physical Exam  Constitutional: She is oriented to person, place, and time. She appears well-developed and well-nourished.  HENT:  Head: Normocephalic and atraumatic.  Eyes: EOM are normal. Pupils are equal, round, and reactive to light.  Neck:  No carotid bruits  Pulmonary/Chest: Effort normal.  Musculoskeletal:       Left knee: She exhibits effusion (trace).  Neurological: She is alert and oriented to person, place, and time.  Skin: Skin is warm and dry.  Psychiatric: She has a normal mood and affect. Her behavior is normal. Judgment  and thought content normal.    Left Knee Exam   Tenderness  The patient is experiencing no tenderness.     Range of Motion  Extension: 0  Flexion: 100   Other  Sensation: normal Pulse: present Swelling: mild Effusion: effusion (trace) present  Comments:  No signs of reactivity.      Specialty Comments:  No specialty comments available.  Imaging: No results found.   PMFS History: Patient Active Problem List   Diagnosis Date Noted  . Factor V Leiden ([place]) 09/28/2016  . Primary osteoarthritis of left knee 09/28/2016  . Renal hemorrhage, left 06/24/2015   Past Medical History:   Diagnosis Date  . Factor V Leiden ([place])   . History of DVT of lower extremity   . History of nephrolithiasis   . History of pulmonary embolism    1987  &  1998  . Hyperlipidemia   . Mild obstructive sleep apnea    per pt study done 2006 (approx.)  mild OSA no cpap recommended  . PTSD (post-traumatic stress disorder)    NIGHT TERRORS  . Renal calculus, left     No family history on file.  Past Surgical History:  Procedure Laterality Date  . CERVICAL FUSION  1997   C6 - 7  . CYSTOSCOPY W/ URETERAL STENT PLACEMENT Left 06/18/2015   Procedure: CYSTOSCOPY WITH STENT REPLACEMENT;  Surgeon: Cleon Gustin, MD;  Location: [facility];  Service: Urology;  Laterality: Left;  . CYSTOSCOPY WITH RETROGRADE PYELOGRAM, URETEROSCOPY AND STENT PLACEMENT Left 06/04/2015   Procedure: CYSTOSCOPY WITH RETROGRADE PYELOGRAM,  AND STENT PLACEMENT;  Surgeon: Cleon Gustin, MD;  Location: [facility];  Service: Urology;  Laterality: Left;  . CYSTOSCOPY WITH RETROGRADE PYELOGRAM, URETEROSCOPY AND STENT PLACEMENT Left 07/13/2015   Procedure: CYSTOSCOPY/RETROGRADE/STONE EXTRACTION WITH BASKET AND STENT EXCHANGE/PLACEMENT;  Surgeon: Cleon Gustin, MD;  Location: [facility];  Service: Urology;  Laterality: Left;  . CYSTOSCOPY WITH URETEROSCOPY Left 06/18/2015   Procedure: CYSTOSCOPY WITH URETEROSCOPY;  Surgeon: Cleon Gustin, MD;  Location[facility];  Service: Urology;  Laterality: Left;  . CYSTOSCOPY/RETROGRADE/URETEROSCOPY/STONE EXTRACTION WITH BASKET Left 06/18/2015   Procedure: CYSTOSCOPY/RETROGRADE/STONE EXTRACTION WITH BASKET;  Surgeon: Cleon Gustin, MD;  Location: [facility];  Service: Urology;  Laterality: Left;  . HOLMIUM LASER APPLICATION Left [id number]   Procedure: HOLMIUM LASER APPLICATION;  Surgeon: Cleon Gustin, MD;  Location: [facility];  Service: Urology;  Laterality: Left;   Social History    Occupational History  . Not on file.   Social History Main Topics  . Smoking status: Never Smoker  . Smokeless tobacco: Never Used  . Alcohol use 1.8 oz/week    3 Glasses of wine per week     Comment: weekends only  . Drug use: No  . Sexual activity: Not on file

## 2017-02-02 ENCOUNTER — Encounter: Payer: Self-pay | Admitting: Physician Assistant

## 2017-02-09 ENCOUNTER — Encounter: Payer: Self-pay | Admitting: Physician Assistant

## 2017-02-09 ENCOUNTER — Ambulatory Visit (INDEPENDENT_AMBULATORY_CARE_PROVIDER_SITE_OTHER): Payer: BLUE CROSS/BLUE SHIELD | Admitting: Physician Assistant

## 2017-02-09 ENCOUNTER — Telehealth: Payer: Self-pay | Admitting: Emergency Medicine

## 2017-02-09 VITALS — BP 142/82 | HR 70 | Ht 65.25 in | Wt 299.8 lb

## 2017-02-09 DIAGNOSIS — Z01818 Encounter for other preprocedural examination: Secondary | ICD-10-CM

## 2017-02-09 DIAGNOSIS — Z1211 Encounter for screening for malignant neoplasm of colon: Secondary | ICD-10-CM | POA: Diagnosis not present

## 2017-02-09 DIAGNOSIS — Z1212 Encounter for screening for malignant neoplasm of rectum: Secondary | ICD-10-CM

## 2017-02-09 DIAGNOSIS — Z7901 Long term (current) use of anticoagulants: Secondary | ICD-10-CM | POA: Diagnosis not present

## 2017-02-09 DIAGNOSIS — D6851 Activated protein C resistance: Secondary | ICD-10-CM

## 2017-02-09 MED ORDER — NA SULFATE-K SULFATE-MG SULF 17.5-3.13-1.6 GM/177ML PO SOLN: 1.0000 | ORAL | 0 refills | Status: DC

## 2017-02-09 NOTE — Patient Instructions (Signed)

## 2017-02-09 NOTE — Telephone Encounter (Signed)
   Margaret Phillips 05-03-1961 [id number]  Dear Dr. Ardeth Perfect:  We have scheduled the above named patient for a(n) colonoscopy procedure. Our records show that (s)he is on anticoagulation therapy.  Please advise as to whether the patient may come off their therapy of Coumadin 7 days prior to their procedure which is scheduled for 02-27-17.  Please route your response to Tinnie Gens, CMA or fax response to [phone].  Sincerely,    [facility]

## 2017-02-09 NOTE — Progress Notes (Signed)
I reviewed the clinic note.  Due to her BMI nearly 50 and need for bridging lovenox, this patient's colonoscopy must be moved to the hospital endoscopy lab.  Also, coumadin only needs to be held for the 4 days prior to procedure.  Please have nursing contact the patient and make these changes in instructions and location.

## 2017-02-09 NOTE — Progress Notes (Signed)
Chief Complaint: Preprocedural exam for a screening colonoscopy in a patient on chronic anticoagulation with Coumadin  HPI:   Margaret Phillips is a 56 year old Caucasian female with a past medical history of Factor V Leiden and a history of DVT as well as pulmonary embolism maintained on Coumadin, mild obstructive sleep apnea and other history listed below, who presents to clinic today for preprocedural exam for screening colonoscopy.   Today, the patient tells me that about 10 years ago she was found to have iron deficiency anemia and did have an endoscopy and colonoscopy at that time which did not find an etiology. This was done in [place], we do not have report. Patient tells me there were no findings of polyps or other. She describes being on iron infusions for about 2 years, but since then she is no longer iron deficienct and has stopped this. Patient has a long history of being on Warfarin for Factor V Leiden which was diagnosed in the late 69s. She has been on Lovenox when holding this medication for multiple procedures in the past and is very well versed with this medicine and Lovenox usage for bridging. She denies any GI complaints today.   Patient denies fever, chills, blood in her stool, melena, change in bowel habits, weight loss, fatigue, anorexia, nausea, vomiting or abdominal pain.    Past Medical History:  Diagnosis Date  . Factor V Leiden ([place])   . History of DVT of lower extremity   . History of nephrolithiasis   . History of pulmonary embolism    1987  &  1998  . Hyperlipidemia   . Mild obstructive sleep apnea    per pt study done 2006 (approx.)  mild OSA no cpap recommended  . PTSD (post-traumatic stress disorder)    NIGHT TERRORS  . Renal calculus, left     Past Surgical History:  Procedure Laterality Date  . CERVICAL FUSION  1997   C6 - 7  . CYSTOSCOPY W/ URETERAL STENT PLACEMENT Left 06/18/2015   Procedure: CYSTOSCOPY WITH STENT REPLACEMENT;  Surgeon: Cleon Gustin, MD;  Location: [facility];  Service: Urology;  Laterality: Left;  . CYSTOSCOPY WITH RETROGRADE PYELOGRAM, URETEROSCOPY AND STENT PLACEMENT Left 06/04/2015   Procedure: CYSTOSCOPY WITH RETROGRADE PYELOGRAM,  AND STENT PLACEMENT;  Surgeon: Cleon Gustin, MD;  Location: [facility];  Service: Urology;  Laterality: Left;  . CYSTOSCOPY WITH RETROGRADE PYELOGRAM, URETEROSCOPY AND STENT PLACEMENT Left 07/13/2015   Procedure: CYSTOSCOPY/RETROGRADE/STONE EXTRACTION WITH BASKET AND STENT EXCHANGE/PLACEMENT;  Surgeon: Cleon Gustin, MD;  Location: [facility];  Service: Urology;  Laterality: Left;  . CYSTOSCOPY WITH URETEROSCOPY Left 06/18/2015   Procedure: CYSTOSCOPY WITH URETEROSCOPY;  Surgeon: Cleon Gustin, MD;  Location: [facility];  Service: Urology;  Laterality: Left;  . CYSTOSCOPY/RETROGRADE/URETEROSCOPY/STONE EXTRACTION WITH BASKET Left 06/18/2015   Procedure: CYSTOSCOPY/RETROGRADE/STONE EXTRACTION WITH BASKET;  Surgeon: Cleon Gustin, MD;  Location: [facility];  Service: Urology;  Laterality: Left;  . HOLMIUM LASER APPLICATION Left 01/20/2702   Procedure: HOLMIUM LASER APPLICATION;  Surgeon: Cleon Gustin, MD;  Location: [facility];  Service: Urology;  Laterality: Left;    Current Outpatient Prescriptions  Medication Sig Dispense Refill  . acetaminophen (TYLENOL) 500 MG tablet Take 1,000 mg by mouth every 6 (six) hours as needed for fever.    Marland Kitchen atorvastatin (LIPITOR) 10 MG tablet Take 10 mg by mouth at bedtime.  4  . clonazePAM (KLONOPIN) 0.5 MG  tablet Take 0.5 mg by mouth 2 (two) times daily as needed for anxiety.    Marland Kitchen oxybutynin (DITROPAN-XL) 10 MG 24 hr tablet TK 1 T PO QD  3  . prazosin (MINIPRESS) 2 MG capsule Take 2 mg by mouth at bedtime.   7  . sertraline (ZOLOFT) 100 MG tablet Take 150 mg by mouth at bedtime.     Marland Kitchen warfarin (COUMADIN) 5 MG tablet Take 10-15 mg by mouth daily. TAKES 15MG   ON MON, WED TAKES 10MG  ALL OTHER DAYS     No current facility-administered medications for this visit.     Allergies as of 02/09/2017 - Review Complete 02/09/2017  Allergen Reaction Noted  . Betadine [povidone iodine] Hives 06/03/2015  . Contrast media [iodinated diagnostic agents] Anaphylaxis 06/03/2015  . Iodine Anaphylaxis and Hives 06/04/2015  . Latex Itching 07/13/2015    No family history on file.  Social History   Social History  . Marital status: Married    Spouse name: N/A  . Number of children: N/A  . Years of education: N/A   Occupational History  . Not on file.   Social History Main Topics  . Smoking status: Never Smoker  . Smokeless tobacco: Never Used  . Alcohol use 1.8 oz/week    3 Glasses of wine per week     Comment: weekends only  . Drug use: No  . Sexual activity: Not on file   Other Topics Concern  . Not on file   Social History Narrative  . No narrative on file    Review of Systems:    Constitutional: No weight loss, fever or chills Skin: No rash  Cardiovascular: No chest pain Respiratory: No SOB  Gastrointestinal: See HPI and otherwise negative Genitourinary: No dysuria Neurological: No headache Musculoskeletal: No new muscle or joint pain Hematologic: Positive history of factor V Leiden Psychiatric: Positive history of anxiety   Physical Exam:  Vital signs: Ht 5' 5.25" (1.657 m) Comment: measured without shoes  Wt 299 lb 12.8 oz (136 kg)   BMI 49.51 kg/m   Constitutional:   Pleasant obese Caucasian female appears to be in NAD, Well developed, Well nourished, alert and cooperative Head:  Normocephalic and atraumatic. Eyes:   PEERL, EOMI. No icterus. Conjunctiva pink. Ears:  Normal auditory acuity. Neck:  Supple Throat: Oral cavity and pharynx without inflammation, swelling or lesion.  Respiratory: Respirations even and unlabored. Lungs clear to auscultation bilaterally.   No wheezes, crackles, or rhonchi.  Cardiovascular:  Normal S1, S2. No MRG. Regular rate and rhythm. No peripheral edema, cyanosis or pallor.  Gastrointestinal:  Soft, nondistended, nontender. No rebound or guarding. Normal bowel sounds. No appreciable masses or hepatomegaly. Rectal:  Not performed.  Msk:  Symmetrical without gross deformities. Without edema, no deformity or joint abnormality.  Neurologic:  Alert and  oriented x4;  grossly normal neurologically.  Skin:   Dry and intact without significant lesions or rashes. Psychiatric:. Demonstrates good judgement and reason without abnormal affect or behaviors.  No recent labs or imaging.  Assessment: 1. Preprocedural exam for screening colonoscopy in a patient on chronic anticoagulation: Patient's last colonoscopy was 10 years ago for iron deficiency anemia when she was 56 y/o, we do not have report of this, though patient tells me was normal, she is due now for a screening for colon polyps, she is maintained on Coumadin for Factor V Leiden and history of PE/DVT 2. Factor V Leiden  Plan: 1. Patient was advised to hold her Coumadin for  7 days prior to her procedure. We will discuss holding her Coumadin with her doctor to ensure that this is acceptable for her. In the past, patient has been bridged with Lovenox, likely this will still be the case with her Factor V Leiden and increased risk for PE/DVT which she has experienced, last in the late 90s. 2. Scheduled patient for a colonoscopy with Dr. Loletha Carrow in the [facility]. Did discuss risks, benefits, limitations and alternatives and the patient agrees to proceed. 3. Patient to follow in clinic per Dr. Corena Pilgrim recommendations after time of procedure.  Margaret Newer, PA-C [facility] 02/09/2017, 2:23 PM  Cc: No ref. provider found

## 2017-02-10 NOTE — Addendum Note (Signed)
Addended by: Wyline Beady on: 02/10/2017 04:32 PM   Modules accepted: Orders, SmartSet

## 2017-02-13 ENCOUNTER — Encounter: Payer: Self-pay | Admitting: Gastroenterology

## 2017-02-13 ENCOUNTER — Telehealth: Payer: Self-pay | Admitting: Gastroenterology

## 2017-02-13 NOTE — Telephone Encounter (Signed)
Patient returning call to Vivien Rota regarding this best call back number is [phone].

## 2017-02-13 NOTE — Telephone Encounter (Signed)
Left message to return call 

## 2017-02-13 NOTE — Telephone Encounter (Signed)
Spoke to patient she was able to get an appt with Dr. Ardeth Perfect today and they will instruct her on holding coumadin and her Lovenox bridge in the meantime.

## 2017-02-14 ENCOUNTER — Other Ambulatory Visit: Payer: Self-pay | Admitting: Emergency Medicine

## 2017-02-14 DIAGNOSIS — Z1211 Encounter for screening for malignant neoplasm of colon: Secondary | ICD-10-CM

## 2017-02-14 DIAGNOSIS — Z7901 Long term (current) use of anticoagulants: Secondary | ICD-10-CM

## 2017-02-14 DIAGNOSIS — D6851 Activated protein C resistance: Secondary | ICD-10-CM

## 2017-02-14 NOTE — Telephone Encounter (Signed)
Pt returned call, she was instructed to pick up a sample bowel prep.

## 2017-02-15 ENCOUNTER — Telehealth: Payer: Self-pay | Admitting: Gastroenterology

## 2017-02-15 ENCOUNTER — Encounter (HOSPITAL_COMMUNITY): Payer: Self-pay | Admitting: *Deleted

## 2017-02-15 NOTE — Telephone Encounter (Signed)
After looking through the notes, and what is scheduled, patient is correct and she should only be scheduled for a colonoscopy. Called central scheduling to correct procedure. Called patient to let her know that she is only having the colonoscopy and not endocolon. Verified she had prep and where she should go for the procedure on 5/4.

## 2017-02-17 ENCOUNTER — Encounter (HOSPITAL_COMMUNITY): Payer: Self-pay

## 2017-02-17 ENCOUNTER — Encounter (HOSPITAL_COMMUNITY)
Admission: RE | Disposition: A | Discharge status: Home / Self Care | Payer: Self-pay | Source: Ambulatory Visit | Attending: Gastroenterology

## 2017-02-17 ENCOUNTER — Ambulatory Visit (HOSPITAL_COMMUNITY)
Admission: RE | Admit: 2017-02-17 | Discharge: 2017-02-17 | Disposition: A | Discharge status: Home / Self Care | Payer: BLUE CROSS/BLUE SHIELD | Source: Ambulatory Visit | Attending: Gastroenterology | Admitting: Gastroenterology

## 2017-02-17 ENCOUNTER — Ambulatory Visit (HOSPITAL_COMMUNITY): Payer: BLUE CROSS/BLUE SHIELD | Admitting: Anesthesiology

## 2017-02-17 DIAGNOSIS — Z86711 Personal history of pulmonary embolism: Secondary | ICD-10-CM | POA: Insufficient documentation

## 2017-02-17 DIAGNOSIS — E785 Hyperlipidemia, unspecified: Secondary | ICD-10-CM | POA: Diagnosis not present

## 2017-02-17 DIAGNOSIS — Z6841 Body Mass Index (BMI) 40.0 and over, adult: Secondary | ICD-10-CM | POA: Insufficient documentation

## 2017-02-17 DIAGNOSIS — Z1212 Encounter for screening for malignant neoplasm of rectum: Secondary | ICD-10-CM | POA: Diagnosis not present

## 2017-02-17 DIAGNOSIS — D6851 Activated protein C resistance: Secondary | ICD-10-CM | POA: Diagnosis not present

## 2017-02-17 DIAGNOSIS — G4733 Obstructive sleep apnea (adult) (pediatric): Secondary | ICD-10-CM | POA: Diagnosis not present

## 2017-02-17 DIAGNOSIS — Z87442 Personal history of urinary calculi: Secondary | ICD-10-CM | POA: Diagnosis not present

## 2017-02-17 DIAGNOSIS — Z79899 Other long term (current) drug therapy: Secondary | ICD-10-CM | POA: Insufficient documentation

## 2017-02-17 DIAGNOSIS — Z91041 Radiographic dye allergy status: Secondary | ICD-10-CM | POA: Insufficient documentation

## 2017-02-17 DIAGNOSIS — Z91040 Latex allergy status: Secondary | ICD-10-CM | POA: Insufficient documentation

## 2017-02-17 DIAGNOSIS — K5730 Diverticulosis of large intestine without perforation or abscess without bleeding: Secondary | ICD-10-CM | POA: Diagnosis not present

## 2017-02-17 DIAGNOSIS — Z86718 Personal history of other venous thrombosis and embolism: Secondary | ICD-10-CM | POA: Diagnosis not present

## 2017-02-17 DIAGNOSIS — D509 Iron deficiency anemia, unspecified: Secondary | ICD-10-CM | POA: Diagnosis not present

## 2017-02-17 DIAGNOSIS — Z888 Allergy status to other drugs, medicaments and biological substances status: Secondary | ICD-10-CM | POA: Insufficient documentation

## 2017-02-17 DIAGNOSIS — K640 First degree hemorrhoids: Secondary | ICD-10-CM | POA: Diagnosis not present

## 2017-02-17 DIAGNOSIS — D122 Benign neoplasm of ascending colon: Secondary | ICD-10-CM

## 2017-02-17 DIAGNOSIS — Z7901 Long term (current) use of anticoagulants: Secondary | ICD-10-CM | POA: Insufficient documentation

## 2017-02-17 DIAGNOSIS — Z981 Arthrodesis status: Secondary | ICD-10-CM | POA: Insufficient documentation

## 2017-02-17 DIAGNOSIS — Z01818 Encounter for other preprocedural examination: Secondary | ICD-10-CM

## 2017-02-17 DIAGNOSIS — Z1211 Encounter for screening for malignant neoplasm of colon: Secondary | ICD-10-CM | POA: Diagnosis not present

## 2017-02-17 HISTORY — PX: COLONOSCOPY WITH PROPOFOL: SHX5780

## 2017-02-17 SURGERY — COLONOSCOPY WITH PROPOFOL
Anesthesia: Monitor Anesthesia Care

## 2017-02-17 MED ORDER — LACTATED RINGERS IV SOLN
INTRAVENOUS | Status: DC
Start: 2017-02-17 — End: 2017-02-17
  Administered 2017-02-17: 1000 mL via INTRAVENOUS

## 2017-02-17 MED ORDER — SODIUM CHLORIDE 0.9 % IV SOLN: INTRAVENOUS | Status: DC

## 2017-02-17 MED ORDER — PROPOFOL 10 MG/ML IV BOLUS
INTRAVENOUS | Status: AC
  Filled 2017-02-17: qty 40

## 2017-02-17 MED ORDER — PROPOFOL 10 MG/ML IV BOLUS
INTRAVENOUS | Status: DC | PRN
  Administered 2017-02-17 (×3): 20 mg via INTRAVENOUS
  Administered 2017-02-17: 40 mg via INTRAVENOUS
  Administered 2017-02-17: 20 mg via INTRAVENOUS
  Administered 2017-02-17: 40 mg via INTRAVENOUS
  Administered 2017-02-17: 50 mg via INTRAVENOUS
  Administered 2017-02-17: 40 mg via INTRAVENOUS
  Administered 2017-02-17: 30 mg via INTRAVENOUS

## 2017-02-17 MED ORDER — PROPOFOL 10 MG/ML IV BOLUS
INTRAVENOUS | Status: AC
  Filled 2017-02-17: qty 20

## 2017-02-17 MED ORDER — PROPOFOL 500 MG/50ML IV EMUL
INTRAVENOUS | Status: DC | PRN
  Administered 2017-02-17: 75 ug/kg/min via INTRAVENOUS

## 2017-02-17 SURGICAL SUPPLY — 21 items

## 2017-02-17 NOTE — Op Note (Addendum)
[facility] Patient Name: Margaret Phillips Procedure Date: 02/17/2017 MRN: [id number] Attending MD: Estill Cotta. Margaret Phillips , MD Date of Birth: 1960-12-05 CSN: [id number] Age: 57 Admit Type: Outpatient Procedure:                Colonoscopy Indications:              Screening for colorectal malignant neoplasm (the                            patient reports a normal colonoscopy 10-11 years                            prior) Providers:                Mallie Mussel L. Margaret Carrow, MD, Carolynn Comment, RN, Corliss Parish, Technician Referring MD:              Medicines:                Monitored Anesthesia Care Complications:            No immediate complications. Estimated Blood Loss:     Estimated blood loss: none. Procedure:                Pre-Anesthesia Assessment:                           - Prior to the procedure, a History and Physical                            was performed, and patient medications and                            allergies were reviewed. The patient's tolerance of                            previous anesthesia was also reviewed. The risks                            and benefits of the procedure and the sedation                            options and risks were discussed with the patient.                            All questions were answered, and informed consent                            was obtained. Prior Anticoagulants: The patient has                            taken Coumadin (warfarin), last dose was 4 days                            prior to procedure.  The last dose of Lovenox was                            the evening prior to the procedure. ASA Grade                            Assessment: III - A patient with severe systemic                            disease. After reviewing the risks and benefits,                            the patient was deemed in satisfactory condition to                            undergo the procedure.           After obtaining informed consent, the colonoscope                            was passed under direct vision. Throughout the                            procedure, the patient's blood pressure, pulse, and                            oxygen saturations were monitored continuously. The                            was introduced through the anus and advanced to the                            the cecum, identified by appendiceal orifice and                            ileocecal valve. The colonoscopy was performed with                            moderate difficulty due to significant looping and                            the patient's body habitus. Successful completion                            of the procedure was aided by using manual                            pressure. The patient tolerated the procedure                            fairly well. The quality of the bowel preparation                            was excellent. The ileocecal valve, appendiceal  orifice, and rectum were photographed. The quality                            of the bowel preparation was evaluated using the                            BBPS [facility] Bowel Preparation Scale) with scores                            of: Right Colon = 3, Transverse Colon = 3 and Left                            Colon = 3 (entire mucosa seen well with no residual                            staining, small fragments of stool or opaque                            liquid). The total BBPS score equals 9. The bowel                            preparation used was SUPREP. Scope In: 10:48:14 AM Scope Out: 11:16:51 AM Scope Withdrawal Time: 0 hours 22 minutes 40 seconds  Total Procedure Duration: 0 hours 28 minutes 37 seconds  Findings:      The perianal and digital rectal examinations were normal.      Multiple small-mouthed diverticula were found in the left colon.      A 10 mm polyp was found in the proximal ascending  colon. The polyp was       sessile. The polyp was removed with a hot snare. Resection and retrieval       were complete. To prevent bleeding post-intervention, one hemostatic       clip was successfully placed (MR conditional). There was no bleeding       during the procedure.      Internal hemorrhoids were found during retroflexion and during anoscopy.       The hemorrhoids were medium-sized and Grade I (internal hemorrhoids that       do not prolapse).      The exam was otherwise without abnormality on direct and retroflexion       views. Impression:               - Diverticulosis in the left colon.                           - One 10 mm polyp in the proximal ascending colon,                            removed with a hot snare. Resected and retrieved.                            Clip (MR conditional) was placed.                           - Internal hemorrhoids.                           -  The examination was otherwise normal on direct                            and retroflexion views. Moderate Sedation:      MAC sedation used Recommendation:           - Patient has a contact number available for                            emergencies. The signs and symptoms of potential                            delayed complications were discussed with the                            patient. Return to normal activities tomorrow.                            Written discharge instructions were provided to the                            patient.                           - Resume previous diet.                           - Continue present medications.                           - Resume Coumadin (warfarin) tomorrow and Lovenox                            (enoxaparin) tomorrow morning at prior doses.                           - Await pathology results.                           - Repeat colonoscopy is recommended for                            surveillance. The colonoscopy date will be                             determined after pathology results from today's                            exam become available for review. Procedure Code(s):        --- Professional ---                           [phone], Colonoscopy, flexible; with removal of                            tumor(s), polyp(s), or other lesion(s) by snare  technique Diagnosis Code(s):        --- Professional ---                           Z12.11, Encounter for screening for malignant                            neoplasm of colon                           K64.0, First degree hemorrhoids                           D12.2, Benign neoplasm of ascending colon                           K57.30, Diverticulosis of large intestine without                            perforation or abscess without bleeding CPT copyright 2016 American Medical Association. All rights reserved. The codes documented in this report are preliminary and upon coder review may  be revised to meet current compliance requirements. Margaret L. Margaret Carrow, MD 02/17/2017 11:22:45 AM This report has been signed electronically. Number of Addenda: 0

## 2017-02-17 NOTE — Anesthesia Preprocedure Evaluation (Addendum)
Anesthesia Evaluation  Patient identified by MRN, date of birth, ID band Patient awake    Reviewed: Allergy & Precautions, H&P , Patient's Chart, lab work & pertinent test results, reviewed documented beta blocker date and time   Airway Mallampati: III  TM Distance: >3 FB Neck ROM: full    Dental no notable dental hx.    Pulmonary sleep apnea ,    Pulmonary exam normal breath sounds clear to auscultation       Cardiovascular  Rhythm:regular Rate:Normal     Neuro/Psych    GI/Hepatic   Endo/Other  Morbid obesity  Renal/GU      Musculoskeletal   Abdominal   Peds  Hematology   Anesthesia Other Findings   Reproductive/Obstetrics                            Anesthesia Physical Anesthesia Plan  ASA: III  Anesthesia Plan: MAC   Post-op Pain Management:    Induction: Intravenous  Airway Management Planned: Mask and Natural Airway  Additional Equipment:   Intra-op Plan:   Post-operative Plan:   Informed Consent: I have reviewed the patients History and Physical, chart, labs and discussed the procedure including the risks, benefits and alternatives for the proposed anesthesia with the patient or authorized representative who has indicated his/her understanding and acceptance.   Dental Advisory Given  Plan Discussed with: CRNA and Surgeon  Anesthesia Plan Comments:         Anesthesia Quick Evaluation

## 2017-02-17 NOTE — Anesthesia Postprocedure Evaluation (Signed)
Anesthesia Post Note  Patient: Margaret Phillips  Procedure(s) Performed: Procedure(s) (LRB): COLONOSCOPY WITH PROPOFOL (N/A)  Patient location during evaluation: PACU Anesthesia Type: MAC Level of consciousness: awake and alert Pain management: pain level controlled Vital Signs Assessment: post-procedure vital signs reviewed and stable Respiratory status: spontaneous breathing, nonlabored ventilation, respiratory function stable and patient connected to nasal cannula oxygen Cardiovascular status: stable and blood pressure returned to baseline Anesthetic complications: no       Last Vitals:  Vitals:   02/17/17 1130 02/17/17 1140  BP: 129/89 (!) 153/86  Pulse: 78 72  Resp: 15 17  Temp:      Last Pain:  Vitals:   02/17/17 1140  TempSrc:   PainSc: 0-No pain                 Mandi Mattioli EDWARD

## 2017-02-17 NOTE — Interval H&P Note (Signed)
History and Physical Interval Note:  02/17/2017 10:25 AM  Margaret Phillips  has presented today for surgery, with the diagnosis of chronic anticoagulation, screening colon, factor V leiden deficiency/db  The various methods of treatment have been discussed with the patient and family. After consideration of risks, benefits and other options for treatment, the patient has consented to  Procedure(s): COLONOSCOPY WITH PROPOFOL (N/A) as a surgical intervention .  The patient's history has been reviewed, patient examined, no change in status, stable for surgery.  I have reviewed the patient's chart and labs.  Questions were answered to the patient's satisfaction.     [name]

## 2017-02-17 NOTE — Discharge Instructions (Signed)
Resume your lovenox at your usual dose tomorrow morning.  Resume your coumadin at your usual dose tomorrow.  We will send you a letter with polyp results.

## 2017-02-17 NOTE — Transfer of Care (Signed)
Immediate Anesthesia Transfer of Care Note  Patient: Margaret Phillips  Procedure(s) Performed: Procedure(s): COLONOSCOPY WITH PROPOFOL (N/A)  Patient Location: PACU  Anesthesia Type:MAC  Level of Consciousness:  sedated, patient cooperative and responds to stimulation  Airway & Oxygen Therapy:Patient Spontanous Breathing and Patient connected to face mask oxgen  Post-op Assessment:  Report given to PACU RN and Post -op Vital signs reviewed and stable  Post vital signs:  Reviewed and stable  Last Vitals:  Vitals:   02/17/17 0940 02/17/17 1125  BP: (!) 151/75 (!) 109/48  Pulse: 70 78  Resp: 11 14  Temp: 36.5 C 38.2 C    Complications: No apparent anesthesia complications

## 2017-02-17 NOTE — H&P (View-Only) (Signed)
Chief Complaint: Preprocedural exam for a screening colonoscopy in a patient on chronic anticoagulation with Coumadin  HPI:   Margaret Phillips is a 56 year old Caucasian female with a past medical history of Factor V Leiden and a history of DVT as well as pulmonary embolism maintained on Coumadin, mild obstructive sleep apnea and other history listed below, who presents to clinic today for preprocedural exam for screening colonoscopy.   Today, the patient tells me that about 10 years ago she was found to have iron deficiency anemia and did have an endoscopy and colonoscopy at that time which did not find an etiology. This was done in [place], we do not have report. Patient tells me there were no findings of polyps or other. She describes being on iron infusions for about 2 years, but since then she is no longer iron deficienct and has stopped this. Patient has a long history of being on Warfarin for Factor V Leiden which was diagnosed in the late 69s. She has been on Lovenox when holding this medication for multiple procedures in the past and is very well versed with this medicine and Lovenox usage for bridging. She denies any GI complaints today.   Patient denies fever, chills, blood in her stool, melena, change in bowel habits, weight loss, fatigue, anorexia, nausea, vomiting or abdominal pain.    Past Medical History:  Diagnosis Date  . Factor V Leiden ([place])   . History of DVT of lower extremity   . History of nephrolithiasis   . History of pulmonary embolism    1987  &  1998  . Hyperlipidemia   . Mild obstructive sleep apnea    per pt study done 2006 (approx.)  mild OSA no cpap recommended  . PTSD (post-traumatic stress disorder)    NIGHT TERRORS  . Renal calculus, left     Past Surgical History:  Procedure Laterality Date  . CERVICAL FUSION  1997   C6 - 7  . CYSTOSCOPY W/ URETERAL STENT PLACEMENT Left 06/18/2015   Procedure: CYSTOSCOPY WITH STENT REPLACEMENT;  Surgeon: Cleon Gustin, MD;  Location: [facility];  Service: Urology;  Laterality: Left;  . CYSTOSCOPY WITH RETROGRADE PYELOGRAM, URETEROSCOPY AND STENT PLACEMENT Left 06/04/2015   Procedure: CYSTOSCOPY WITH RETROGRADE PYELOGRAM,  AND STENT PLACEMENT;  Surgeon: Cleon Gustin, MD;  Location: [facility];  Service: Urology;  Laterality: Left;  . CYSTOSCOPY WITH RETROGRADE PYELOGRAM, URETEROSCOPY AND STENT PLACEMENT Left 07/13/2015   Procedure: CYSTOSCOPY/RETROGRADE/STONE EXTRACTION WITH BASKET AND STENT EXCHANGE/PLACEMENT;  Surgeon: Cleon Gustin, MD;  Location: [facility];  Service: Urology;  Laterality: Left;  . CYSTOSCOPY WITH URETEROSCOPY Left 06/18/2015   Procedure: CYSTOSCOPY WITH URETEROSCOPY;  Surgeon: Cleon Gustin, MD;  Location: [facility];  Service: Urology;  Laterality: Left;  . CYSTOSCOPY/RETROGRADE/URETEROSCOPY/STONE EXTRACTION WITH BASKET Left 06/18/2015   Procedure: CYSTOSCOPY/RETROGRADE/STONE EXTRACTION WITH BASKET;  Surgeon: Cleon Gustin, MD;  Location: [facility];  Service: Urology;  Laterality: Left;  . HOLMIUM LASER APPLICATION Left 11/23/348   Procedure: HOLMIUM LASER APPLICATION;  Surgeon: Cleon Gustin, MD;  Location: [facility];  Service: Urology;  Laterality: Left;    Current Outpatient Prescriptions  Medication Sig Dispense Refill  . acetaminophen (TYLENOL) 500 MG tablet Take 1,000 mg by mouth every 6 (six) hours as needed for fever.    Marland Kitchen atorvastatin (LIPITOR) 10 MG tablet Take 10 mg by mouth at bedtime.  4  . clonazePAM (KLONOPIN) 0.5 MG  tablet Take 0.5 mg by mouth 2 (two) times daily as needed for anxiety.    Marland Kitchen oxybutynin (DITROPAN-XL) 10 MG 24 hr tablet TK 1 T PO QD  3  . prazosin (MINIPRESS) 2 MG capsule Take 2 mg by mouth at bedtime.   7  . sertraline (ZOLOFT) 100 MG tablet Take 150 mg by mouth at bedtime.     Marland Kitchen warfarin (COUMADIN) 5 MG tablet Take 10-15 mg by mouth daily. TAKES 15MG   ON MON, WED TAKES 10MG  ALL OTHER DAYS     No current facility-administered medications for this visit.     Allergies as of 02/09/2017 - Review Complete 02/09/2017  Allergen Reaction Noted  . Betadine [povidone iodine] Hives 06/03/2015  . Contrast media [iodinated diagnostic agents] Anaphylaxis 06/03/2015  . Iodine Anaphylaxis and Hives 06/04/2015  . Latex Itching 07/13/2015    No family history on file.  Social History   Social History  . Marital status: Married    Spouse name: N/A  . Number of children: N/A  . Years of education: N/A   Occupational History  . Not on file.   Social History Main Topics  . Smoking status: Never Smoker  . Smokeless tobacco: Never Used  . Alcohol use 1.8 oz/week    3 Glasses of wine per week     Comment: weekends only  . Drug use: No  . Sexual activity: Not on file   Other Topics Concern  . Not on file   Social History Narrative  . No narrative on file    Review of Systems:    Constitutional: No weight loss, fever or chills Skin: No rash  Cardiovascular: No chest pain Respiratory: No SOB  Gastrointestinal: See HPI and otherwise negative Genitourinary: No dysuria Neurological: No headache Musculoskeletal: No new muscle or joint pain Hematologic: Positive history of factor V Leiden Psychiatric: Positive history of anxiety   Physical Exam:  Vital signs: Ht 5' 5.25" (1.657 m) Comment: measured without shoes  Wt 299 lb 12.8 oz (136 kg)   BMI 49.51 kg/m   Constitutional:   Pleasant obese Caucasian female appears to be in NAD, Well developed, Well nourished, alert and cooperative Head:  Normocephalic and atraumatic. Eyes:   PEERL, EOMI. No icterus. Conjunctiva pink. Ears:  Normal auditory acuity. Neck:  Supple Throat: Oral cavity and pharynx without inflammation, swelling or lesion.  Respiratory: Respirations even and unlabored. Lungs clear to auscultation bilaterally.   No wheezes, crackles, or rhonchi.  Cardiovascular:  Normal S1, S2. No MRG. Regular rate and rhythm. No peripheral edema, cyanosis or pallor.  Gastrointestinal:  Soft, nondistended, nontender. No rebound or guarding. Normal bowel sounds. No appreciable masses or hepatomegaly. Rectal:  Not performed.  Msk:  Symmetrical without gross deformities. Without edema, no deformity or joint abnormality.  Neurologic:  Alert and  oriented x4;  grossly normal neurologically.  Skin:   Dry and intact without significant lesions or rashes. Psychiatric:. Demonstrates good judgement and reason without abnormal affect or behaviors.  No recent labs or imaging.  Assessment: 1. Preprocedural exam for screening colonoscopy in a patient on chronic anticoagulation: Patient's last colonoscopy was 10 years ago for iron deficiency anemia when she was 56 y/o, we do not have report of this, though patient tells me was normal, she is due now for a screening for colon polyps, she is maintained on Coumadin for Factor V Leiden and history of PE/DVT 2. Factor V Leiden  Plan: 1. Patient was advised to hold her Coumadin for  7 days prior to her procedure. We will discuss holding her Coumadin with her doctor to ensure that this is acceptable for her. In the past, patient has been bridged with Lovenox, likely this will still be the case with her Factor V Leiden and increased risk for PE/DVT which she has experienced, last in the late 90s. 2. Scheduled patient for a colonoscopy with Dr. Loletha Carrow in the [facility]. Did discuss risks, benefits, limitations and alternatives and the patient agrees to proceed. 3. Patient to follow in clinic per Dr. Corena Pilgrim recommendations after time of procedure.  Ellouise Newer, PA-C [facility] 02/09/2017, 2:23 PM  Cc: No ref. provider found

## 2017-02-20 ENCOUNTER — Encounter (HOSPITAL_COMMUNITY): Payer: Self-pay | Admitting: Gastroenterology

## 2017-02-21 ENCOUNTER — Encounter: Payer: Self-pay | Admitting: Gastroenterology

## 2017-02-27 ENCOUNTER — Encounter: Payer: BLUE CROSS/BLUE SHIELD | Admitting: Gastroenterology

## 2017-04-28 NOTE — Anesthesia Postprocedure Evaluation (Signed)
Anesthesia Post Note  Patient: Margaret Phillips  Procedure(s) Performed: Procedure(s) (LRB): COLONOSCOPY WITH PROPOFOL (N/A)     Anesthesia Post Evaluation  Last Vitals:  Vitals:   02/17/17 1130 02/17/17 1140  BP: 129/89 (!) 153/86  Pulse: 78 72  Resp: 15 17  Temp:      Last Pain:  Vitals:   02/20/17 1548  TempSrc:   PainSc: 0-No pain                 Hortencia Martire EDWARD

## 2017-04-28 NOTE — Addendum Note (Signed)
Addendum  created 04/28/17 1216 by Lyndle Herrlich, MD   Sign clinical note

## 2017-07-03 ENCOUNTER — Ambulatory Visit (INDEPENDENT_AMBULATORY_CARE_PROVIDER_SITE_OTHER): Payer: BLUE CROSS/BLUE SHIELD

## 2017-07-03 ENCOUNTER — Telehealth (INDEPENDENT_AMBULATORY_CARE_PROVIDER_SITE_OTHER): Payer: Self-pay | Admitting: Radiology

## 2017-07-03 ENCOUNTER — Encounter (INDEPENDENT_AMBULATORY_CARE_PROVIDER_SITE_OTHER): Payer: Self-pay | Admitting: Orthopaedic Surgery

## 2017-07-03 ENCOUNTER — Ambulatory Visit (INDEPENDENT_AMBULATORY_CARE_PROVIDER_SITE_OTHER): Payer: BLUE CROSS/BLUE SHIELD | Admitting: Orthopaedic Surgery

## 2017-07-03 VITALS — BP 124/74 | HR 82 | Resp 18 | Ht 66.0 in | Wt 296.0 lb

## 2017-07-03 DIAGNOSIS — M1712 Unilateral primary osteoarthritis, left knee: Secondary | ICD-10-CM

## 2017-07-03 MED ORDER — BUPIVACAINE HCL 0.5 % IJ SOLN
3.0000 mL | INTRAMUSCULAR | Status: AC | PRN
  Administered 2017-07-03: 3 mL via INTRA_ARTICULAR

## 2017-07-03 MED ORDER — METHYLPREDNISOLONE ACETATE 40 MG/ML IJ SUSP
80.0000 mg | INTRAMUSCULAR | Status: AC | PRN
  Administered 2017-07-03: 80 mg

## 2017-07-03 MED ORDER — LIDOCAINE HCL 1 % IJ SOLN
5.0000 mL | INTRAMUSCULAR | Status: AC | PRN
  Administered 2017-07-03: 5 mL

## 2017-07-03 NOTE — Telephone Encounter (Signed)
Apply for Hyalgan left knee per Dr Elly Modena

## 2017-07-03 NOTE — Progress Notes (Signed)
Office Visit Note   Patient: Margaret Phillips           Date of Birth: Aug 29, 1961           MRN: [id number] Visit Date: 07/03/2017              Requested by: Velna Hatchet, MD [address] [place], [place] [address] PCP: Velna Hatchet, MD   Assessment & Plan: Visit Diagnoses:  1. Unilateral primary osteoarthritis, left knee     Plan: Cortisone injection left knee. Discussion on weight loss. Precertify Visco supplementation.  Follow-Up Instructions: No Follow-up on file.   Orders:  Orders Placed This Encounter  Procedures  . XR KNEE 3 VIEW LEFT   No orders of the defined types were placed in this encounter.     Procedures: Large Joint Inj Date/Time: 07/03/2017 1:33 PM Performed by: Garald Balding Authorized by: Garald Balding   Consent Given by:  Patient Timeout: prior to procedure the correct patient, procedure, and site was verified   Indications:  Pain and joint swelling Location:  Knee Site:  L knee Prep: patient was prepped and draped in usual sterile fashion   Needle Size:  25 G Needle Length:  1.5 inches Approach:  Anteromedial Ultrasound Guidance: No   Fluoroscopic Guidance: No   Arthrogram: No   Medications:  5 mL lidocaine 1 %; 80 mg methylPREDNISolone acetate 40 MG/ML; 3 mL bupivacaine 0.5 % Aspiration Attempted: No   Patient tolerance:  Patient tolerated the procedure well with no immediate complications     Clinical Data: No additional findings.   Subjective: Chief Complaint  Patient presents with  . Left Knee - Pain  Margaret Phillips has been followed for the osteoarthritis of her left knee. She's also had a prior arthroscopic procedure of her left knee for a tear of the medial meniscus. She completed a course of Hyalgan in December 2017 and notes that she was doing relatively well until about July 1 of 2018. She started having trouble with her knee [place] with [place] occasional "catch". He seems to be localized  along the medial compartment. She's not had any locking. She's not sure she's had any swelling. She's not having any significant back pain or groin discomfort. Knee arthroscopy performed 07/07/2016 with a tear of the medial meniscus and areas of grade 3 and 4 changes in the medial compartment particularly on the femoral side.  HPI  Review of Systems  Constitutional: Negative.   HENT: Negative.   Eyes: Negative.   Respiratory: Negative.   Cardiovascular: Negative.   Gastrointestinal: Negative.   Endocrine: Negative.   Genitourinary: Negative.   Musculoskeletal: Positive for arthralgias.       Knee pain left   Skin: Negative.   Allergic/Immunologic: Negative.   Neurological: Negative.   Hematological: Negative.   Psychiatric/Behavioral: Negative.      Objective: Vital Signs: BP 124/74   Pulse 82   Resp 18   Ht 5\' 6"  (1.676 m)   Wt 296 lb (134.3 kg)   LMP 07/17/2014 (Within Months)   BMI 47.78 kg/m   Physical Exam  Ortho Exam left knee without effusion. Localized tenderness along the medial compartment without popping or clicking. Minimal patellar crepitation. No lateral joint pain. Instability. No calf pain. No swelling distally. Neurovascular exam intact. Mild limp. Straight leg raise negative. Painless range of motion both hips. Awake alert and oriented 3 without shortness of breath or chest pain  Specialty  Comments:  No specialty comments available.  Imaging: No results found.   PMFS History: Patient Active Problem List   Diagnosis Date Noted  . Screening for colorectal cancer   . Benign neoplasm of ascending colon   . Factor V Leiden ([place]) 09/28/2016  . Primary osteoarthritis of left knee 09/28/2016  . Renal hemorrhage, left 06/24/2015   Past Medical History:  Diagnosis Date  . Factor V Leiden ([place])   . History of DVT of lower extremity   . History of nephrolithiasis   . History of pulmonary embolism    1987  &  1998  . Hyperlipidemia   . PTSD  (post-traumatic stress disorder)    NIGHT TERRORS  . Renal calculus, left     Family History  Problem Relation Age of Onset  . Breast cancer Mother     Past Surgical History:  Procedure Laterality Date  . CERVICAL FUSION  1997   C6 - 7  . COLONOSCOPY WITH PROPOFOL N/A 02/17/2017   Procedure: COLONOSCOPY WITH PROPOFOL;  Surgeon: Doran Stabler, MD;  Location: [facility];  Service: Gastroenterology;  Laterality: N/A;  . CYSTOSCOPY W/ URETERAL STENT PLACEMENT Left 06/18/2015   Procedure: CYSTOSCOPY WITH STENT REPLACEMENT;  Surgeon: Cleon Gustin, MD;  Location: [facility];  Service: Urology;  Laterality: Left;  . CYSTOSCOPY WITH RETROGRADE PYELOGRAM, URETEROSCOPY AND STENT PLACEMENT Left 06/04/2015   Procedure: CYSTOSCOPY WITH RETROGRADE PYELOGRAM,  AND STENT PLACEMENT;  Surgeon: Cleon Gustin, MD;  Location: [facility];  Service: Urology;  Laterality: Left;  . CYSTOSCOPY WITH RETROGRADE PYELOGRAM, URETEROSCOPY AND STENT PLACEMENT Left 07/13/2015   Procedure: CYSTOSCOPY/RETROGRADE/STONE EXTRACTION WITH BASKET AND STENT EXCHANGE/PLACEMENT;  Surgeon: Cleon Gustin, MD;  Location: [facility];  Service: Urology;  Laterality: Left;  . CYSTOSCOPY WITH URETEROSCOPY Left 06/18/2015   Procedure: CYSTOSCOPY WITH URETEROSCOPY;  Surgeon: Cleon Gustin, MD;  Location: [facility];  Service: Urology;  Laterality: Left;  . CYSTOSCOPY/RETROGRADE/URETEROSCOPY/STONE EXTRACTION WITH BASKET Left 06/18/2015   Procedure: CYSTOSCOPY/RETROGRADE/STONE EXTRACTION WITH BASKET;  Surgeon: Cleon Gustin, MD;  Location: [facility];  Service: Urology;  Laterality: Left;  . HOLMIUM LASER APPLICATION Left 04/17/946   Procedure: HOLMIUM LASER APPLICATION;  Surgeon: Cleon Gustin, MD;  Location: [facility];  Service: Urology;  Laterality: Left;  . KNEE SURGERY Left 06/2016   Social History   Occupational History  . Not on  file.   Social History Main Topics  . Smoking status: Never Smoker  . Smokeless tobacco: Never Used  . Alcohol use 1.8 oz/week    3 Glasses of wine per week     Comment: weekends only  . Drug use: No  . Sexual activity: Not on file

## 2017-07-05 ENCOUNTER — Ambulatory Visit (INDEPENDENT_AMBULATORY_CARE_PROVIDER_SITE_OTHER): Payer: BLUE CROSS/BLUE SHIELD | Admitting: Orthopedic Surgery

## 2017-07-11 ENCOUNTER — Telehealth (INDEPENDENT_AMBULATORY_CARE_PROVIDER_SITE_OTHER): Payer: Self-pay | Admitting: Orthopaedic Surgery

## 2017-07-11 ENCOUNTER — Ambulatory Visit (INDEPENDENT_AMBULATORY_CARE_PROVIDER_SITE_OTHER): Payer: BLUE CROSS/BLUE SHIELD | Admitting: Orthopaedic Surgery

## 2017-07-11 NOTE — Telephone Encounter (Signed)
Patient left a message requesting an update on status of gel injection orders. Please call to advise.

## 2017-07-12 NOTE — Telephone Encounter (Signed)
Patient called to check status of Hyalgan injections. Please advise.

## 2017-07-18 ENCOUNTER — Telehealth (INDEPENDENT_AMBULATORY_CARE_PROVIDER_SITE_OTHER): Payer: Self-pay | Admitting: Orthopaedic Surgery

## 2017-07-18 NOTE — Telephone Encounter (Signed)
Patient calling in reference to Hyalgan injections. Per patient she received a call to get medication delivered, but now Insurance has no record of this. Please call patient to advise.

## 2017-07-18 NOTE — Telephone Encounter (Signed)
Form faxed, pending benefits, I called patient.

## 2017-07-31 ENCOUNTER — Telehealth: Payer: Self-pay

## 2017-07-31 NOTE — Telephone Encounter (Signed)
Patient was calling to check the status for Hyalgan injection.  Would like to know if it has been approved by her insurance.  CB# is [phone].  Please advise.  Thank You.

## 2017-08-16 ENCOUNTER — Ambulatory Visit (INDEPENDENT_AMBULATORY_CARE_PROVIDER_SITE_OTHER): Payer: BLUE CROSS/BLUE SHIELD

## 2017-08-16 ENCOUNTER — Ambulatory Visit (INDEPENDENT_AMBULATORY_CARE_PROVIDER_SITE_OTHER): Payer: BLUE CROSS/BLUE SHIELD | Admitting: Orthopedic Surgery

## 2017-08-16 DIAGNOSIS — M5441 Lumbago with sciatica, right side: Secondary | ICD-10-CM

## 2017-08-16 DIAGNOSIS — M1712 Unilateral primary osteoarthritis, left knee: Secondary | ICD-10-CM | POA: Diagnosis not present

## 2017-08-16 MED ORDER — HYDROCODONE-ACETAMINOPHEN 5-325 MG PO TABS: 1.0000 | ORAL_TABLET | ORAL | 0 refills | Status: AC | PRN

## 2017-08-16 MED ORDER — PREDNISONE 5 MG (21) PO TBPK: ORAL_TABLET | ORAL | 0 refills | Status: AC

## 2017-08-16 MED ORDER — LIDOCAINE HCL 1 % IJ SOLN
3.0000 mL | INTRAMUSCULAR | Status: AC | PRN
  Administered 2017-08-16: 3 mL

## 2017-08-16 MED ORDER — METHOCARBAMOL 500 MG PO TABS: 500.0000 mg | ORAL_TABLET | Freq: Three times a day (TID) | ORAL | 0 refills | Status: AC | PRN

## 2017-08-16 MED ORDER — SODIUM HYALURONATE (VISCOSUP) 20 MG/2ML IX SOSY
20.0000 mg | PREFILLED_SYRINGE | INTRA_ARTICULAR | Status: AC | PRN
  Administered 2017-08-16: 20 mg via INTRA_ARTICULAR

## 2017-08-16 NOTE — Progress Notes (Signed)
Office Visit Note   Patient: Margaret Phillips           Date of Birth: 1960-12-23           MRN: [id number] Visit Date: 08/16/2017              Requested by: Velna Hatchet, MD [address] [place], [place] [address] PCP: Velna Hatchet, MD   Assessment & Plan: Visit Diagnoses:  1. Unilateral primary osteoarthritis, left knee   2. Right-sided low back pain with right-sided sciatica, unspecified chronicity     Plan:  #1: First Euflexa injection was given to the left knee without difficulty. No Betadine was used. #2: Prednisone Dosepak 5 mg 21 days was given and she will cut this in half #3: Schedule MRI scan lumbar spine looking for HNP and/ or foraminal stenosis.  Follow-Up Instructions: Return in about 1 week (around 08/23/2017).   Face-to-face time spent with patient was greater than 30 minutes.  Greater than 50% of the time was spent in counseling and coordination of care for her low back pain and leg pain as well as her knee pain..  Orders:  Orders Placed This Encounter  Procedures  . XR Lumbar Spine 2-3 Views  . MR LUMBAR SPINE WO CONTRAST   Meds ordered this encounter  Medications  . HYDROcodone-acetaminophen (NORCO) 5-325 MG tablet    Sig: Take 1 tablet by mouth every 4 (four) hours as needed for moderate pain or severe pain.    Dispense:  30 tablet    Refill:  0    Order Specific Question:   Supervising Provider    Answer:   Garald Balding [[id number]]  . predniSONE (STERAPRED UNI-PAK 21 TAB) 5 MG (21) TBPK tablet    Sig: As directed    Dispense:  21 tablet    Refill:  0    Order Specific Question:   Supervising Provider    Answer:   Garald Balding [[id number]]  . methocarbamol (ROBAXIN) 500 MG tablet    Sig: Take 1 tablet (500 mg total) by mouth every 8 (eight) hours as needed for muscle spasms.    Dispense:  30 tablet    Refill:  0    Order Specific Question:   Supervising Provider    Answer:   Garald Balding [[id number]]      Procedures: Large Joint  Inj Date/Time: 08/16/2017 12:10 PM Performed by: Biagio Borg D Authorized by: Biagio Borg D   Consent Given by:  Patient Timeout: prior to procedure the correct patient, procedure, and site was verified   Indications:  Pain and joint swelling Location:  Knee Site:  L knee Prep: patient was prepped and draped in usual sterile fashion   Needle Size:  25 G Needle Length:  1.5 inches Approach:  Anteromedial Ultrasound Guidance: No   Fluoroscopic Guidance: No   Arthrogram: No   Medications:  20 mg Sodium Hyaluronate 20 MG/2ML; 3 mL lidocaine 1 % Aspiration Attempted: No   Patient tolerance:  Patient tolerated the procedure well with no immediate complications     Clinical Data: No additional findings.   Subjective: Chief Complaint  Patient presents with  . Left Knee - Pain  . Lower Back - Pain  . Left Leg - Pain    HPI  Margaret Phillips comes in today for evaluation of 2 problems. In regards to her left knee she has been noted to have osteoarthritis of the knee and had a corticosteroid injection on 07/03/2017 by  Dr. Durward Fortes. There was discussion of course of other things such as weight loss and Visco supplementations. We did have her precertified for the Visco supplementation and she would like to proceed with this.  However she has had a several day history of back pain right buttock pain and right posterior thigh pain now. She says a sciatica symptoms she's had previously and asked she was given the prednisone by another practitioner. This was 5 to 6 weeks ago. She is unable to take any type of nonsteroidal anti-inflammatories. She is unable to sleep in a recliner chair because the pain. She has difficulty with walking. She's tried conservative treatment including bed rest/ heat but her symptoms are worsening. She comes in today requesting an injection.  Review of Systems  Constitutional: Negative.   HENT: Negative.   Eyes: Negative.   Respiratory: Negative.   Cardiovascular:  Negative.   Gastrointestinal: Negative.   Endocrine: Negative.   Genitourinary: Negative.   Musculoskeletal: Positive for arthralgias.       Knee pain left   Skin: Negative.   Allergic/Immunologic: Negative.   Neurological: Negative.   Hematological: Negative.   Psychiatric/Behavioral: Negative.      Objective: Vital Signs: LMP 07/17/2014 (Within Months)   Physical Exam  Constitutional: She is oriented to person, place, and time. She appears well-developed and well-nourished.  HENT:  Head: Normocephalic and atraumatic.  Eyes: Pupils are equal, round, and reactive to light. EOM are normal.  Pulmonary/Chest: Effort normal.  Neurological: She is alert and oriented to person, place, and time.  Skin: Skin is warm and dry.  Psychiatric: She has a normal mood and affect. Her behavior is normal. Judgment and thought content normal.    Ortho Exam  Exam today reveals a positive straight leg raise on the right leg. Deep tendon reflexes were absent in the knees bilaterally. There are 1+ in the ankles bilateral symmetric. She has good strength with testing. She claims decreased sensation in lateral and posterior aspect of the calf on the right in comparison to the left.  Left knee reveals range of motion from near full extension to approximately 100. She does have some patellofemoral crepitance minimally. Unable to tell whether she has an effusion. Skin is intact. No ecchymosis or erythema.   Specialty Comments:  No specialty comments available.  Imaging: Xr Lumbar Spine 2-3 Views  Result Date: 08/16/2017 Two-view x-ray of the lumbar spine reveals marked facet sclerosing and degenerative changes at L5-S1. Is difficult to see detail secondary to bowel gas and the obesity of the patient.    PMFS History: Patient Active Problem List   Diagnosis Date Noted  . Screening for colorectal cancer   . Benign neoplasm of ascending colon   . Factor V Leiden ([place]) 09/28/2016  . Primary  osteoarthritis of left knee 09/28/2016  . Renal hemorrhage, left 06/24/2015   Past Medical History:  Diagnosis Date  . Factor V Leiden ([place])   . History of DVT of lower extremity   . History of nephrolithiasis   . History of pulmonary embolism    1987  &  1998  . Hyperlipidemia   . PTSD (post-traumatic stress disorder)    NIGHT TERRORS  . Renal calculus, left     Family History  Problem Relation Age of Onset  . Breast cancer Mother     Past Surgical History:  Procedure Laterality Date  . CERVICAL FUSION  1997   C6 - 7  . COLONOSCOPY WITH PROPOFOL N/A 02/17/2017  Procedure: COLONOSCOPY WITH PROPOFOL;  Surgeon: Doran Stabler, MD;  Location: [facility];  Service: Gastroenterology;  Laterality: N/A;  . CYSTOSCOPY W/ URETERAL STENT PLACEMENT Left 06/18/2015   Procedure: CYSTOSCOPY WITH STENT REPLACEMENT;  Surgeon: Cleon Gustin, MD;  Location: [facility];  Service: Urology;  Laterality: Left;  . CYSTOSCOPY WITH RETROGRADE PYELOGRAM, URETEROSCOPY AND STENT PLACEMENT Left 06/04/2015   Procedure: CYSTOSCOPY WITH RETROGRADE PYELOGRAM,  AND STENT PLACEMENT;  Surgeon: Cleon Gustin, MD;  Location: [facility];  Service: Urology;  Laterality: Left;  . CYSTOSCOPY WITH RETROGRADE PYELOGRAM, URETEROSCOPY AND STENT PLACEMENT Left 07/13/2015   Procedure: CYSTOSCOPY/RETROGRADE/STONE EXTRACTION WITH BASKET AND STENT EXCHANGE/PLACEMENT;  Surgeon: Cleon Gustin, MD;  Location: [facility];  Service: Urology;  Laterality: Left;  . CYSTOSCOPY WITH URETEROSCOPY Left 06/18/2015   Procedure: CYSTOSCOPY WITH URETEROSCOPY;  Surgeon: Cleon Gustin, MD;  [facility];  Service: Urology;  Laterality: Left;  . CYSTOSCOPY/RETROGRADE/URETEROSCOPY/STONE EXTRACTION WITH BASKET Left 06/18/2015   Procedure: CYSTOSCOPY/RETROGRADE/STONE EXTRACTION WITH BASKET;  Surgeon: Cleon Gustin, MD;  Location: [facility];  Service: Urology;   Laterality: Left;  . HOLMIUM LASER APPLICATION Left 02/20/8615   Procedure: HOLMIUM LASER APPLICATION;  Surgeon: Cleon Gustin, MD;  Location[facility];  Service: Urology;  Laterality: Left;  . KNEE SURGERY Left 06/2016   Social History   Occupational History  . Not on file.   Social History Main Topics  . Smoking status: Never Smoker  . Smokeless tobacco: Never Used  . Alcohol use 1.8 oz/week    3 Glasses of wine per week     Comment: weekends only  . Drug use: No  . Sexual activity: Not on file

## 2017-08-26 ENCOUNTER — Ambulatory Visit
Admission: RE | Admit: 2017-08-26 | Discharge: 2017-08-26 | Disposition: A | Discharge status: Home / Self Care | Payer: BLUE CROSS/BLUE SHIELD | Source: Ambulatory Visit | Attending: Orthopedic Surgery | Admitting: Orthopedic Surgery

## 2017-08-26 DIAGNOSIS — M5441 Lumbago with sciatica, right side: Secondary | ICD-10-CM

## 2017-08-30 ENCOUNTER — Ambulatory Visit (INDEPENDENT_AMBULATORY_CARE_PROVIDER_SITE_OTHER): Payer: BLUE CROSS/BLUE SHIELD | Admitting: Orthopedic Surgery

## 2017-08-30 DIAGNOSIS — M4317 Spondylolisthesis, lumbosacral region: Secondary | ICD-10-CM | POA: Diagnosis not present

## 2017-08-30 DIAGNOSIS — M1712 Unilateral primary osteoarthritis, left knee: Secondary | ICD-10-CM | POA: Diagnosis not present

## 2017-08-30 DIAGNOSIS — M4697 Unspecified inflammatory spondylopathy, lumbosacral region: Secondary | ICD-10-CM

## 2017-08-30 DIAGNOSIS — M47817 Spondylosis without myelopathy or radiculopathy, lumbosacral region: Secondary | ICD-10-CM

## 2017-08-30 MED ORDER — SODIUM HYALURONATE (VISCOSUP) 20 MG/2ML IX SOSY
20.0000 mg | PREFILLED_SYRINGE | INTRA_ARTICULAR | Status: AC | PRN
  Administered 2017-08-30: 20 mg via INTRA_ARTICULAR

## 2017-08-30 MED ORDER — LIDOCAINE HCL 1 % IJ SOLN
2.0000 mL | INTRAMUSCULAR | Status: AC | PRN
  Administered 2017-08-30: 2 mL

## 2017-08-30 NOTE — Progress Notes (Signed)
Office Visit Note   Patient: Margaret Phillips           Date of Birth: Jul 05, 1961           MRN: [id number] Visit Date: 08/30/2017              Requested by: Velna Hatchet, MD [address] [place], [place] [address] PCP: Velna Hatchet, MD   Assessment & Plan: Visit Diagnoses:  1. Unilateral primary osteoarthritis, left knee   2. Facet arthritis of lumbosacral region ([place])   3. Spondylolisthesis, lumbosacral region   4.      L5-S1 mild bilateral facet edema, likely degenerative. Edema and L5 pedicles without fracture, likely stress reaction. Lumbar spondylosis greatest at L4-5 and L5-S1 levels. L4-5 grade 1 anterolisthesis. L4-5 multifactorial mild canal stenosis. No high-grade canal stenosis. Mild bilateral L4-5 and L5-S1 foraminal stenosis. No high-grade foraminal stenosis.  Plan:  #1: Second Euflex injection was given without difficulty. Tolerated procedure well #2: We will schedule her for injections at [facility] imaging based upon her most recent MRI scan of the lumbar spine. Her pain is mainly in the lower portion of the lumbar spine and does not have any type of radicular symptoms at this time. She has factor V Leiden and will need bridging  Follow-Up Instructions: Return in about 1 week (around 09/06/2017).   Face-to-face time spent with patient was greater than 30 minutes.  Greater than 50% of the time was spent in counseling and coordination of care in response to her lumbar spine pathology. We had to go over the fact that with factor V Leiden that she would need bridging. Also explanation of the the results of the MRI and correlation to her pain and discomfort was also reviewed and explained her.  Orders:  Orders Placed This Encounter  Procedures  . Ambulatory referral to Physical Medicine Rehab   No orders of the defined types were placed in this encounter.     Procedures: Large Joint Inj: L knee on 08/30/2017 11:04 AM Indications: pain and joint swelling Details:  25 G 1.5 in needle, anteromedial approach  Arthrogram: No  Medications: 2 mL lidocaine 1 %; 20 mg Sodium Hyaluronate 20 MG/2ML Outcome: tolerated well, no immediate complications Procedure, treatment alternatives, risks and benefits explained, specific risks discussed. Consent was given by the patient. Immediately prior to procedure a time out was called to verify the correct patient, procedure, equipment, support staff and site/side marked as required. Patient was prepped and draped in the usual sterile fashion.       Clinical Data: No additional findings.   Subjective: Chief Complaint  Patient presents with  . Left Knee - Pain  . Lower Back - Pain    HPI  Margaret Phillips is a pleasant 56 year old white female who is seen today for her second Euflexa injection to the left knee. She states that the she's had improvement with this. She did have a history of an arthroscopic procedure back in the past with a tear of the medial meniscus. She has done well with Visco supplementation the past. She returns for her second injection.  With also at her last office was complaining of low back pain. He has had a chronic problem with the back pain but this unfortunately was worsening. She did not have any radicular symptoms. This is mainly in the lumbar spine. She's had problems in the past with this but this time it's quite symptomatic. I chose to obtain an MRI scan of the lumbar spine  and she returns today for review of her MRI scan the an treatment plan.  Review of Systems  Constitutional: Negative.   HENT: Negative.   Eyes: Negative.   Respiratory: Negative.   Cardiovascular: Negative.   Gastrointestinal: Negative.   Endocrine: Negative.   Genitourinary: Negative.   Musculoskeletal: Positive for arthralgias.       Knee pain left   Skin: Negative.   Allergic/Immunologic: Negative.   Neurological: Negative.   Hematological:       Factor V Leiden  Psychiatric/Behavioral: Negative.       Objective: Vital Signs: BP 130/82   Pulse 78   Resp 18   Ht 5\' 6"  (1.676 m)   Wt 296 lb (134.3 kg)   LMP 07/17/2014 (Within Months)   BMI 47.78 kg/m   Physical Exam  Constitutional: She is oriented to person, place, and time. She appears well-developed and well-nourished.  HENT:  Head: Normocephalic and atraumatic.  Eyes: EOM are normal. Pupils are equal, round, and reactive to light.  Pulmonary/Chest: Effort normal.  Neurological: She is alert and oriented to person, place, and time.  Skin: Skin is warm and dry.  Psychiatric: She has a normal mood and affect. Her behavior is normal. Judgment and thought content normal.    Ortho Exam  Left knee exam reveals no warmth or erythema. She continues to have some localized tenderness along the joint lines. Some patellofemoral crepitance is noted. Difficult telling whether she has no effusion at this time. No warmth or erythema.  She has negative straight leg raising bilaterally. She's got the good range of motion both ears. She's neurovascular intact distally.  Specialty Comments:  No specialty comments available.  Imaging: Mr Lumbar Spine Wo Contrast  Result Date: 08/27/2017 CLINICAL DATA:  56 y/o F; right lower back pain radiating into the right leg with numbness. EXAM: MRI LUMBAR SPINE WITHOUT CONTRAST TECHNIQUE: Multiplanar, multisequence MR imaging of the lumbar spine was performed. No intravenous contrast was administered. COMPARISON:  None. FINDINGS: Segmentation:  Standard. Alignment:  Stable grade 1 L4-5 anterolisthesis. Vertebrae: No evidence for fracture or discitis. L5-S1 bilateral facet edema, likely degenerative. Edema within the L5 pedicles bilaterally without appreciable fracture, likely stress reaction. Conus medullaris: Extends to the L1-2 level and appears normal. Paraspinal and other soft tissues: Right kidney interpolar 10 mm T2 hyperintense well-circumscribed focus, likely cyst. Disc levels: L1-2: No  significant disc displacement, foraminal stenosis, or canal stenosis. L2-3: Small disc bulge eccentric to the left. Mild left lateral recess narrowing. No significant foraminal or canal stenosis. L3-4: Small disc bulge and mild facet hypertrophy. Mild lateral recess narrowing. No significant foraminal or canal stenosis. L4-5: Anterolisthesis with small disc bulge and moderate bilateral facet hypertrophy. Mild bilateral foraminal and canal stenosis. L5-S1: Small disc bulge and severe facet hypertrophy. Mild bilateral foraminal stenosis. No significant canal stenosis. IMPRESSION: 1. L5-S1 mild bilateral facet edema, likely degenerative. Edema and L5 pedicles without fracture, likely stress reaction. 2. Lumbar spondylosis greatest at L4-5 and L5-S1 levels. L4-5 grade 1 anterolisthesis. 3. L4-5 multifactorial mild canal stenosis. No high-grade canal stenosis. 4. Mild bilateral L4-5 and L5-S1 foraminal stenosis. No high-grade foraminal stenosis. Electronically Signed   By: Kristine Garbe M.D.   On: 08/27/2017 01:45     PMFS History: Patient Active Problem List   Diagnosis Date Noted  . Screening for colorectal cancer   . Benign neoplasm of ascending colon   . Factor V Leiden ([place]) 09/28/2016  . Primary osteoarthritis of left knee 09/28/2016  .  Renal hemorrhage, left 06/24/2015   Past Medical History:  Diagnosis Date  . Factor V Leiden ([place])   . History of DVT of lower extremity   . History of nephrolithiasis   . History of pulmonary embolism    1987  &  1998  . Hyperlipidemia   . PTSD (post-traumatic stress disorder)    NIGHT TERRORS  . Renal calculus, left     Family History  Problem Relation Age of Onset  . Breast cancer Mother     Past Surgical History:  Procedure Laterality Date  . CERVICAL FUSION  1997   C6 - 7  . KNEE SURGERY Left 06/2016   Social History   Occupational History  . Not on file  Tobacco Use  . Smoking status: Never Smoker  . Smokeless tobacco: Never  Used  Substance and Sexual Activity  . Alcohol use: Yes    Alcohol/week: 1.8 oz    Types: 3 Glasses of wine per week    Comment: weekends only  . Drug use: No  . Sexual activity: Not on file

## 2017-09-01 ENCOUNTER — Other Ambulatory Visit (INDEPENDENT_AMBULATORY_CARE_PROVIDER_SITE_OTHER): Payer: Self-pay | Admitting: Orthopedic Surgery

## 2017-09-01 DIAGNOSIS — M4317 Spondylolisthesis, lumbosacral region: Secondary | ICD-10-CM

## 2017-09-01 DIAGNOSIS — M47817 Spondylosis without myelopathy or radiculopathy, lumbosacral region: Secondary | ICD-10-CM

## 2017-09-06 ENCOUNTER — Ambulatory Visit (INDEPENDENT_AMBULATORY_CARE_PROVIDER_SITE_OTHER): Payer: BLUE CROSS/BLUE SHIELD | Admitting: Orthopedic Surgery

## 2017-09-06 ENCOUNTER — Encounter (INDEPENDENT_AMBULATORY_CARE_PROVIDER_SITE_OTHER): Payer: Self-pay | Admitting: Orthopedic Surgery

## 2017-09-06 DIAGNOSIS — M1712 Unilateral primary osteoarthritis, left knee: Secondary | ICD-10-CM | POA: Diagnosis not present

## 2017-09-06 MED ORDER — SODIUM HYALURONATE (VISCOSUP) 20 MG/2ML IX SOSY
20.0000 mg | PREFILLED_SYRINGE | INTRA_ARTICULAR | Status: AC | PRN
  Administered 2017-09-06: 20 mg via INTRA_ARTICULAR

## 2017-09-06 MED ORDER — LIDOCAINE HCL 1 % IJ SOLN
2.0000 mL | INTRAMUSCULAR | Status: AC | PRN
  Administered 2017-09-06: 2 mL

## 2017-09-06 NOTE — Progress Notes (Signed)
Office Visit Note   Patient: Margaret Phillips           Date of Birth: [birth year]           MRN: [id number] Visit Date: 09/06/2017              Requested by: Velna Hatchet, MD [address] [place], [place] [address] PCP: Velna Hatchet, MD   Assessment & Plan: Visit Diagnoses:  1. Unilateral primary osteoarthritis, left knee     Plan:  #1: The third Euflexa injection was given without difficulty number to follow back up when necessary  Follow-Up Instructions: Return if symptoms worsen or fail to improve.   Orders:  Orders Placed This Encounter  Procedures  . Large Joint Inj   No orders of the defined types were placed in this encounter.     Procedures: Large Joint Inj: L knee on 09/06/2017 10:17 AM Indications: pain and joint swelling Details: 25 G 1.5 in needle, anteromedial approach  Arthrogram: No  Medications: 2 mL lidocaine 1 %; 20 mg Sodium Hyaluronate 20 MG/2ML Outcome: tolerated well, no immediate complications Procedure, treatment alternatives, risks and benefits explained, specific risks discussed. Consent was given by the patient. Immediately prior to procedure a time out was called to verify the correct patient, procedure, equipment, support staff and site/side marked as required. Patient was prepped and draped in the usual sterile fashion.       Clinical Data: No additional findings.   Subjective: No chief complaint on file.   HPI  Margaret Phillips returns today for her third Euflexa injection. So far she's had some improvement. No reactivity.  Review of Systems  Constitutional: Negative.   HENT: Negative.   Eyes: Negative.   Respiratory: Negative.   Cardiovascular: Negative.   Gastrointestinal: Negative.   Endocrine: Negative.   Genitourinary: Negative.   Musculoskeletal: Positive for arthralgias.       Knee pain left   Skin: Negative.   Allergic/Immunologic: Negative.   Neurological: Negative.   Hematological:       Factor V Leiden    Psychiatric/Behavioral: Negative.      Objective: Vital Signs: LMP 07/17/2014 (Within Months)   Physical Exam  Constitutional: She is oriented to person, place, and time. She appears well-developed and well-nourished.  HENT:  Head: Normocephalic and atraumatic.  Eyes: EOM are normal. Pupils are equal, round, and reactive to light.  Pulmonary/Chest: Effort normal.  Neurological: She is alert and oriented to person, place, and time.  Skin: Skin is warm and dry.  Psychiatric: She has a normal mood and affect. Her behavior is normal. Judgment and thought content normal.    Ortho Exam  Left knee reveals no signs of reactivity. No warmth or erythema. Cannot tell for sure if there is an effusion secondary to the size of her knee.  Specialty Comments:  No specialty comments available.  Imaging: No results found.   PMFS History: Patient Active Problem List   Diagnosis Date Noted  . Screening for colorectal cancer   . Benign neoplasm of ascending colon   . Factor V Leiden ([place]) 09/28/2016  . Primary osteoarthritis of left knee 09/28/2016  . Renal hemorrhage, left 06/24/2015   Past Medical History:  Diagnosis Date  . Factor V Leiden ([place])   . History of DVT of lower extremity   . History of nephrolithiasis   . History of pulmonary embolism    1987  &  1998  . Hyperlipidemia   . PTSD (post-traumatic stress disorder)  NIGHT TERRORS  . Renal calculus, left     Family History  Problem Relation Age of Onset  . Breast cancer Mother     Past Surgical History:  Procedure Laterality Date  . CERVICAL FUSION  1997   C6 - 7  . COLONOSCOPY WITH PROPOFOL N/A 02/17/2017   Procedure: COLONOSCOPY WITH PROPOFOL;  Surgeon: Doran Stabler, MD;  Location: [facility];  Service: Gastroenterology;  Laterality: N/A;  . CYSTOSCOPY W/ URETERAL STENT PLACEMENT Left 06/18/2015   Procedure: CYSTOSCOPY WITH STENT REPLACEMENT;  Surgeon: Cleon Gustin, MD;  Location: [facility];  Service: Urology;  Laterality: Left;  . CYSTOSCOPY WITH RETROGRADE PYELOGRAM, URETEROSCOPY AND STENT PLACEMENT Left 06/04/2015   Procedure: CYSTOSCOPY WITH RETROGRADE PYELOGRAM,  AND STENT PLACEMENT;  Surgeon: Cleon Gustin, MD;  Location: [facility];  Service: Urology;  Laterality: Left;  . CYSTOSCOPY WITH RETROGRADE PYELOGRAM, URETEROSCOPY AND STENT PLACEMENT Left 07/13/2015   Procedure: CYSTOSCOPY/RETROGRADE/STONE EXTRACTION WITH BASKET AND STENT EXCHANGE/PLACEMENT;  Surgeon: Cleon Gustin, MD;  Location: [facility];  Service: Urology;  Laterality: Left;  . CYSTOSCOPY WITH URETEROSCOPY Left 06/18/2015   Procedure: CYSTOSCOPY WITH URETEROSCOPY;  Surgeon: Cleon Gustin, MD;  Location: [facility];  Service: Urology;  Laterality: Left;  . CYSTOSCOPY/RETROGRADE/URETEROSCOPY/STONE EXTRACTION WITH BASKET Left 06/18/2015   Procedure: CYSTOSCOPY/RETROGRADE/STONE EXTRACTION WITH BASKET;  Surgeon: Cleon Gustin, MD;  Location[facility];  Service: Urology;  Laterality: Left;  . HOLMIUM LASER APPLICATION Left 11/20/5571   Procedure: HOLMIUM LASER APPLICATION;  Surgeon: Cleon Gustin, MD;  Location: [facility];  Service: Urology;  Laterality: Left;  . KNEE SURGERY Left 06/2016   Social History   Occupational History  . Not on file  Tobacco Use  . Smoking status: Never Smoker  . Smokeless tobacco: Never Used  Substance and Sexual Activity  . Alcohol use: Yes    Alcohol/week: 1.8 oz    Types: 3 Glasses of wine per week    Comment: weekends only  . Drug use: No  . Sexual activity: Not on file

## 2017-09-12 ENCOUNTER — Telehealth (HOSPITAL_COMMUNITY): Payer: Self-pay

## 2017-09-12 ENCOUNTER — Telehealth (INDEPENDENT_AMBULATORY_CARE_PROVIDER_SITE_OTHER): Payer: Self-pay | Admitting: Orthopaedic Surgery

## 2017-09-12 NOTE — Telephone Encounter (Signed)
Per Dr.Kearns at [place], due to patients severe reaction to Iodine, Betadine, xray dye, patient needs to be scheduled for the spinal injection at [facility]. Please send order, and call patient to advise.

## 2017-09-12 NOTE — Telephone Encounter (Signed)
I thought you handled it!!

## 2017-09-12 NOTE — Telephone Encounter (Signed)
This was already sent to [facility] on 09/04/17, they will contact pt to schedule. Pt can call [facility] also if they like to check on status. Number is [phone]

## 2017-09-12 NOTE — Telephone Encounter (Signed)
Called to schedule ESI, left message for pt to return call. AW

## 2017-09-12 NOTE — Telephone Encounter (Signed)
Please help with this. Margaret Phillips

## 2017-09-18 ENCOUNTER — Other Ambulatory Visit (INDEPENDENT_AMBULATORY_CARE_PROVIDER_SITE_OTHER): Payer: Self-pay | Admitting: Orthopedic Surgery

## 2017-09-18 DIAGNOSIS — M47817 Spondylosis without myelopathy or radiculopathy, lumbosacral region: Secondary | ICD-10-CM

## 2017-09-18 DIAGNOSIS — M4317 Spondylolisthesis, lumbosacral region: Secondary | ICD-10-CM

## 2017-09-20 ENCOUNTER — Telehealth: Payer: Self-pay | Admitting: Student

## 2017-09-20 NOTE — Telephone Encounter (Signed)
Patient scheduled for epidural spinal injection 10/04/17 and has a contrast allergy.  Pre-medications have been called to [facility] on [address].   Spoke with patient to inform her medication had been prescribed.  Brynda Greathouse, MS RD PA-C 11:53 AM

## 2017-10-04 ENCOUNTER — Encounter (HOSPITAL_COMMUNITY): Payer: Self-pay | Admitting: Interventional Radiology

## 2017-10-04 ENCOUNTER — Ambulatory Visit (HOSPITAL_COMMUNITY)
Admission: RE | Admit: 2017-10-04 | Discharge: 2017-10-04 | Disposition: A | Discharge status: Home / Self Care | Payer: BLUE CROSS/BLUE SHIELD | Source: Ambulatory Visit | Attending: Orthopedic Surgery | Admitting: Orthopedic Surgery

## 2017-10-04 ENCOUNTER — Ambulatory Visit (HOSPITAL_COMMUNITY): Payer: BLUE CROSS/BLUE SHIELD

## 2017-10-04 ENCOUNTER — Encounter (HOSPITAL_COMMUNITY): Payer: Self-pay

## 2017-10-04 DIAGNOSIS — M4696 Unspecified inflammatory spondylopathy, lumbar region: Secondary | ICD-10-CM | POA: Diagnosis not present

## 2017-10-04 DIAGNOSIS — M4317 Spondylolisthesis, lumbosacral region: Secondary | ICD-10-CM

## 2017-10-04 DIAGNOSIS — M47817 Spondylosis without myelopathy or radiculopathy, lumbosacral region: Secondary | ICD-10-CM

## 2017-10-04 DIAGNOSIS — M4316 Spondylolisthesis, lumbar region: Secondary | ICD-10-CM | POA: Insufficient documentation

## 2017-10-04 HISTORY — PX: IR INJECT/THERA/INC NEEDLE/CATH/PLC EPI/LUMB/SAC W/IMG: IMG6130

## 2017-10-04 MED ORDER — SODIUM CHLORIDE 0.9 % IJ SOLN
INTRAMUSCULAR | Status: AC
  Administered 2017-10-04: 5 mL
  Filled 2017-10-04: qty 10

## 2017-10-04 MED ORDER — IOPAMIDOL (ISOVUE-M 200) INJECTION 41%
INTRAMUSCULAR | Status: AC
  Filled 2017-10-04: qty 10

## 2017-10-04 MED ORDER — LIDOCAINE HCL (PF) 1 % IJ SOLN
INTRAMUSCULAR | Status: AC
  Filled 2017-10-04: qty 10

## 2017-10-04 MED ORDER — IOPAMIDOL (ISOVUE-M 200) INJECTION 41%
INTRAMUSCULAR | Status: AC
  Administered 2017-10-04: 5 mL
  Filled 2017-10-04: qty 10

## 2017-10-04 MED ORDER — LIDOCAINE HCL (PF) 1 % IJ SOLN
INTRAMUSCULAR | Status: DC | PRN
  Administered 2017-10-04: 10 mL

## 2017-10-04 MED ORDER — METHYLPREDNISOLONE ACETATE 40 MG/ML IJ SUSP
INTRAMUSCULAR | Status: AC
Start: 2017-10-04 — End: 2017-10-04
  Administered 2017-10-04: 40 mg
  Filled 2017-10-04: qty 1

## 2017-10-04 MED ORDER — METHYLPREDNISOLONE ACETATE 80 MG/ML IJ SUSP
INTRAMUSCULAR | Status: AC
  Administered 2017-10-04: 80 mg
  Filled 2017-10-04: qty 1

## 2017-10-04 MED ORDER — SODIUM CHLORIDE 0.9 % IJ SOLN
INTRAMUSCULAR | Status: AC
  Filled 2017-10-04: qty 10

## 2018-01-22 ENCOUNTER — Encounter (INDEPENDENT_AMBULATORY_CARE_PROVIDER_SITE_OTHER): Payer: Self-pay | Admitting: Orthopaedic Surgery

## 2018-01-22 ENCOUNTER — Ambulatory Visit (INDEPENDENT_AMBULATORY_CARE_PROVIDER_SITE_OTHER): Payer: BLUE CROSS/BLUE SHIELD | Admitting: Orthopaedic Surgery

## 2018-01-22 VITALS — BP 143/83 | HR 76 | Resp 18 | Ht 66.0 in | Wt 285.0 lb

## 2018-01-22 DIAGNOSIS — M1712 Unilateral primary osteoarthritis, left knee: Secondary | ICD-10-CM

## 2018-01-22 DIAGNOSIS — M1711 Unilateral primary osteoarthritis, right knee: Secondary | ICD-10-CM | POA: Diagnosis not present

## 2018-01-22 MED ORDER — BUPIVACAINE HCL 0.5 % IJ SOLN
2.0000 mL | INTRAMUSCULAR | Status: AC | PRN
  Administered 2018-01-22: 2 mL via INTRA_ARTICULAR

## 2018-01-22 MED ORDER — LIDOCAINE HCL 1 % IJ SOLN
2.0000 mL | INTRAMUSCULAR | Status: AC | PRN
  Administered 2018-01-22: 2 mL

## 2018-01-22 MED ORDER — METHYLPREDNISOLONE ACETATE 40 MG/ML IJ SUSP
80.0000 mg | INTRAMUSCULAR | Status: AC | PRN
  Administered 2018-01-22: 80 mg

## 2018-01-22 NOTE — Progress Notes (Signed)
Office Visit Note   Patient: Margaret Phillips           Date of Birth: 1961-05-21           MRN: [id number] Visit Date: 01/22/2018              Requested by: Velna Hatchet, MD [address] [place], [place] [address] PCP: Velna Hatchet, MD   Assessment & Plan: Visit Diagnoses:  1. Unilateral primary osteoarthritis, right knee     Plan: History of bilateral knee osteoarthritis.  Completed a course of Visco supplementation in November.  Planning a trip to [place] next week and wanted both knees injected with cortisone.  Will inject and plan to see her back as needed  Follow-Up Instructions: Return if symptoms worsen or fail to improve.   Orders:  Orders Placed This Encounter  Procedures  . Large Joint Inj: L knee  . Large Joint Inj: R knee   No orders of the defined types were placed in this encounter. Established diagnosis of bilateral knee osteoarthritis.  Completed a course of Visco supplementation in November 2018.  Planning a trip to [place] next week and wanted cortisone injections.  Recent injury or trauma    Procedures: Large Joint Inj: L knee on 01/22/2018 10:33 AM Indications: pain and diagnostic evaluation Details: 25 G 1.5 in needle, anteromedial approach  Arthrogram: No  Medications: 2 mL bupivacaine 0.5 %; 2 mL lidocaine 1 %; 80 mg methylPREDNISolone acetate 40 MG/ML Procedure, treatment alternatives, risks and benefits explained, specific risks discussed. Consent was given by the patient. Patient was prepped and draped in the usual sterile fashion.   Large Joint Inj: R knee on 01/22/2018 10:33 AM Indications: pain and diagnostic evaluation Details: 25 G 1.5 in needle, anteromedial approach  Arthrogram: No  Medications: 2 mL lidocaine 1 %; 2 mL bupivacaine 0.5 %; 80 mg methylPREDNISolone acetate 40 MG/ML Procedure, treatment alternatives, risks and benefits explained, specific risks discussed. Consent was given by the patient. Immediately prior to procedure a time  out was called to verify the correct patient, procedure, equipment, support staff and site/side marked as required. Patient was prepped and draped in the usual sterile fashion.       Clinical Data: No additional findings.   Subjective: Chief Complaint  Patient presents with  . Follow-up    WOULD LIKE BIL LAT KNEE INJECTIONS  Completed a course of Visco supplementation in November 2018.  Having some recurrent pain in both of her knees would like cortisone injections contemplating a trip to [place] next week  HPI  Review of Systems  Constitutional: Positive for fatigue.  HENT: Negative for ear pain.   Eyes: Negative for pain.  Respiratory: Negative for cough and shortness of breath.   Cardiovascular: Negative for leg swelling.  Gastrointestinal: Negative for constipation and diarrhea.  Genitourinary: Negative for difficulty urinating.  Musculoskeletal: Negative for back pain and neck pain.  Skin: Negative for rash.  Allergic/Immunologic: Negative for food allergies.  Neurological: Positive for weakness. Negative for light-headedness, numbness and headaches.  Hematological: Bruises/bleeds easily.  Psychiatric/Behavioral: Negative for sleep disturbance.     Objective: Vital Signs: BP (!) 143/83 (BP Location: Left Arm, Patient Position: Sitting, Cuff Size: Normal)   Pulse 76   Resp 18   Ht 5\' 6"  (1.676 m)   Wt 285 lb (129.3 kg)   LMP 07/17/2014 (Within Months)   BMI 46.00 kg/m   Physical Exam  Ortho Exam awake alert and oriented x3.  Comfortable sitting.  Large knees.  No obvious effusion.  Mostly medial joint pain bilaterally with some patellar crepitation.  Full extension and about 90 degrees of flexion.  No calf pain.  Neurovascular exam intact  Specialty Comments:  No specialty comments available.  Imaging: No results found.   PMFS History: Patient Active Problem List   Diagnosis Date Noted  . Screening for colorectal cancer   . Benign neoplasm of ascending  colon   . Factor V Leiden ([place]) 09/28/2016  . Primary osteoarthritis of left knee 09/28/2016  . Renal hemorrhage, left 06/24/2015   Past Medical History:  Diagnosis Date  . Factor V Leiden ([place])   . History of DVT of lower extremity   . History of nephrolithiasis   . History of pulmonary embolism    1987  &  1998  . Hyperlipidemia   . PTSD (post-traumatic stress disorder)    NIGHT TERRORS  . Renal calculus, left     Family History  Problem Relation Age of Onset  . Breast cancer Mother     Past Surgical History:  Procedure Laterality Date  . CERVICAL FUSION  1997   C6 - 7  . COLONOSCOPY WITH PROPOFOL N/A 02/17/2017   Procedure: COLONOSCOPY WITH PROPOFOL;  Surgeon: Doran Stabler, MD;  Location: [facility];  Service: Gastroenterology;  Laterality: N/A;  . CYSTOSCOPY W/ URETERAL STENT PLACEMENT Left 06/18/2015   Procedure: CYSTOSCOPY WITH STENT REPLACEMENT;  Surgeon: Cleon Gustin, MD;  Location: [facility];  Service: Urology;  Laterality: Left;  . CYSTOSCOPY WITH RETROGRADE PYELOGRAM, URETEROSCOPY AND STENT PLACEMENT Left 06/04/2015   Procedure: CYSTOSCOPY WITH RETROGRADE PYELOGRAM,  AND STENT PLACEMENT;  Surgeon: Cleon Gustin, MD;  Location: [facility];  Service: Urology;  Laterality: Left;  . CYSTOSCOPY WITH RETROGRADE PYELOGRAM, URETEROSCOPY AND STENT PLACEMENT Left 07/13/2015   Procedure: CYSTOSCOPY/RETROGRADE/STONE EXTRACTION WITH BASKET AND STENT EXCHANGE/PLACEMENT;  Surgeon: Cleon Gustin, MD;  Location[facility];  Service: Urology;  Laterality: Left;  . CYSTOSCOPY WITH URETEROSCOPY Left 06/18/2015   Procedure: CYSTOSCOPY WITH URETEROSCOPY;  Surgeon: Cleon Gustin, MD;  Location: [facility];  Service: Urology;  Laterality: Left;  . CYSTOSCOPY/RETROGRADE/URETEROSCOPY/STONE EXTRACTION WITH BASKET Left 06/18/2015   Procedure: CYSTOSCOPY/RETROGRADE/STONE EXTRACTION WITH BASKET;  Surgeon: Cleon Gustin, MD;   Location: [facility];  Service: Urology;  Laterality: Left;  . HOLMIUM LASER APPLICATION Left 10/23/6158   Procedure: HOLMIUM LASER APPLICATION;  Surgeon: Cleon Gustin, MD;  Location: [facility];  Service: Urology;  Laterality: Left;  . IR INJECT/THERA/INC NEEDLE/CATH/PLC EPI/LUMB/SAC W/IMG  10/04/2017  . KNEE SURGERY Left 06/2016   Social History   Occupational History  . Not on file  Tobacco Use  . Smoking status: Never Smoker  . Smokeless tobacco: Never Used  Substance and Sexual Activity  . Alcohol use: Yes    Alcohol/week: 1.8 oz    Types: 3 Glasses of wine per week    Comment: weekends only  . Drug use: No  . Sexual activity: Not on file

## 2018-05-25 IMAGING — XA Imaging study
2 series · 2 of 2 positions shown · IV contrast (isovue)
Comparison: none

CLINICAL DATA: facet edema and spondylolisthesis L4-5(see MRI).
Pre-medicated due to history of contrast reaction.

EXAM:
RIGHT L4-5  FACET INJECTION UNDER FLUOROSCOPY
FLUOROSCOPY TIME:  42 Sec 19 mGy
TECHNIQUE: An appropriate skin entry site was determined fluoroscopically and
marked. Operator donned sterile gloves and mask. Site prepped with
ASCIA, draped in usual sterile fashion, and infiltrated locally with
1% lidocaine. A long 22 gauge spinal needle was advanced to the
posterior margin of the right L4-5 facet under fluoroscopy.
Diagnostic injection of 0.5 ml Isovue-M 200 confirmed
intra-articular/juxta-articular spread without any intrathecal or
intravascular component. 120mg Depo-Medrol in 1 ml lidocaine 1% was
administered.
The patient tolerated procedure well, with no immediate complication
or contrast reaction.
COMPLICATIONS:
COMPLICATIONS
None

[Series 1: fl (-) angio · 1 of 1 slices shown (1 of 2)]
[im 1/1]
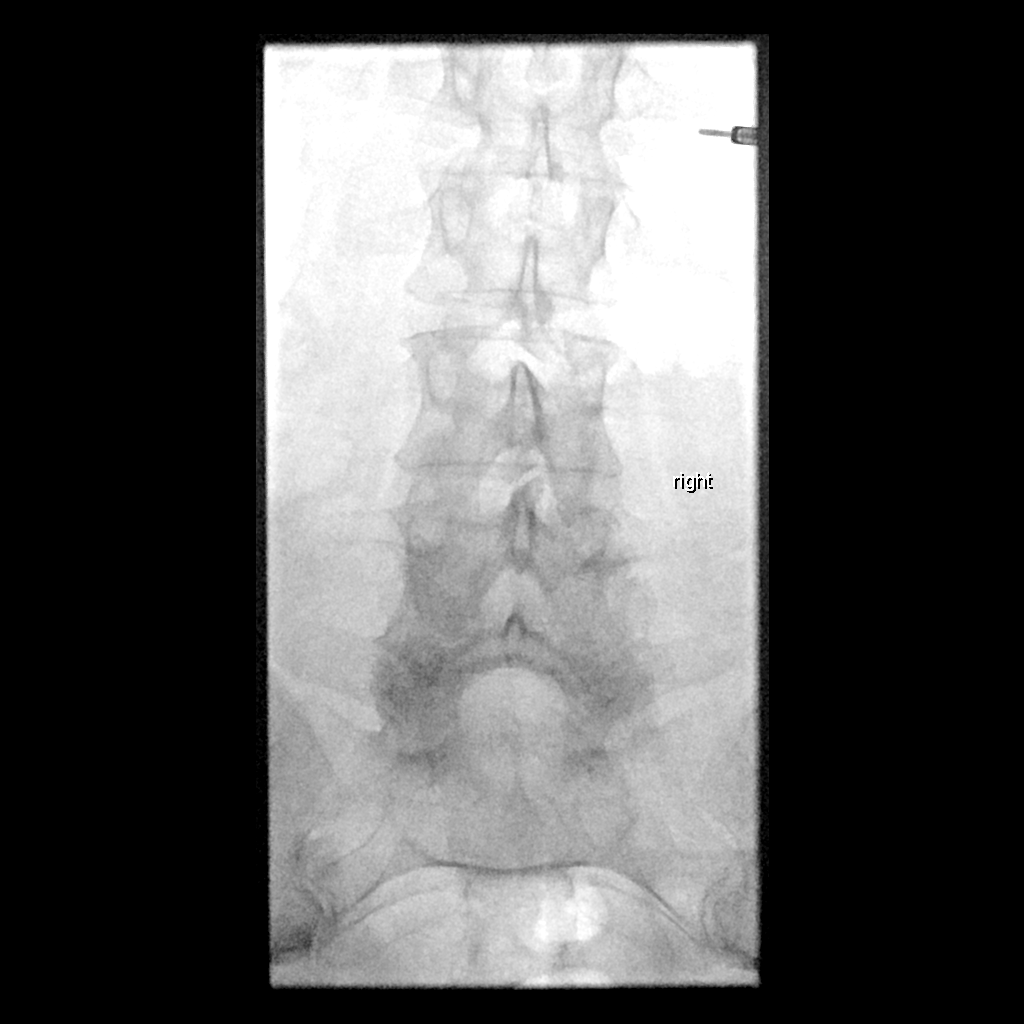

[Series 2: fl (-) angio · 1 of 1 slices shown (2 of 2)]
[im 1/1]
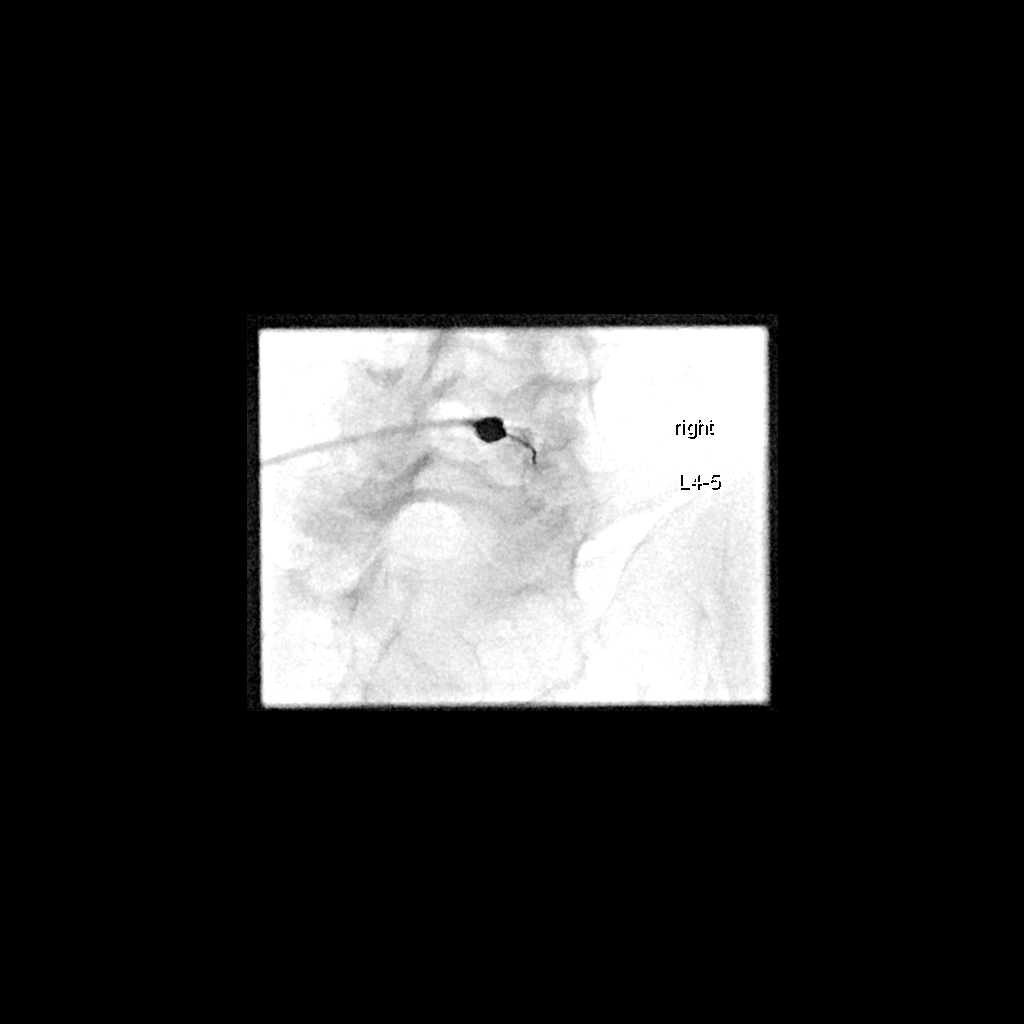

[2 of 2 positions shown; findings below may reference images not displayed]

IMPRESSION: 1. Technically successful right L4-5 facet injection.

## 2021-06-07 ENCOUNTER — Encounter: Payer: Self-pay | Admitting: Gastroenterology
